# Patient Record
Sex: Female | Born: 1965 | Race: White | Hispanic: No | Smoking: Current every day smoker
Health system: Southern US, Community
[De-identification: ages and names within clinical notes are randomized; demographics above are authoritative.]

## PROBLEM LIST (undated history)

## (undated) DIAGNOSIS — G43909 Migraine, unspecified, not intractable, without status migrainosus: Secondary | ICD-10-CM

## (undated) HISTORY — DX: Migraine, unspecified, not intractable, without status migrainosus: G43.909

---

## 2000-11-17 ENCOUNTER — Other Ambulatory Visit: Admission: RE | Admit: 2000-11-17 | Discharge: 2000-11-17 | Payer: Self-pay | Admitting: Obstetrics and Gynecology

## 2002-07-15 ENCOUNTER — Other Ambulatory Visit: Admission: RE | Admit: 2002-07-15 | Discharge: 2002-07-15 | Payer: Self-pay | Admitting: Obstetrics and Gynecology

## 2003-07-26 ENCOUNTER — Other Ambulatory Visit: Admission: RE | Admit: 2003-07-26 | Discharge: 2003-07-26 | Payer: Self-pay | Admitting: Obstetrics and Gynecology

## 2004-12-07 ENCOUNTER — Other Ambulatory Visit: Admission: RE | Admit: 2004-12-07 | Discharge: 2004-12-07 | Payer: Self-pay | Admitting: Obstetrics and Gynecology

## 2011-09-24 ENCOUNTER — Encounter: Payer: Self-pay | Admitting: *Deleted

## 2011-09-24 ENCOUNTER — Emergency Department (HOSPITAL_COMMUNITY)
Admission: EM | Admit: 2011-09-24 | Discharge: 2011-09-24 | Disposition: A | Discharge status: Home / Self Care | Payer: BC Managed Care – PPO | Attending: Emergency Medicine | Admitting: Emergency Medicine

## 2011-09-24 DIAGNOSIS — R112 Nausea with vomiting, unspecified: Secondary | ICD-10-CM | POA: Insufficient documentation

## 2011-09-24 DIAGNOSIS — R10817 Generalized abdominal tenderness: Secondary | ICD-10-CM | POA: Insufficient documentation

## 2011-09-24 DIAGNOSIS — R070 Pain in throat: Secondary | ICD-10-CM | POA: Insufficient documentation

## 2011-09-24 DIAGNOSIS — R197 Diarrhea, unspecified: Secondary | ICD-10-CM | POA: Insufficient documentation

## 2011-09-24 DIAGNOSIS — R1084 Generalized abdominal pain: Secondary | ICD-10-CM | POA: Insufficient documentation

## 2011-09-24 LAB — DIFFERENTIAL
Basophils Absolute: 0 10*3/uL (ref 0.0–0.1)
Eosinophils Absolute: 0 10*3/uL (ref 0.0–0.7)
Eosinophils Relative: 0 % (ref 0–5)
Lymphs Abs: 0.4 10*3/uL — ABNORMAL LOW (ref 0.7–4.0)
Monocytes Absolute: 0.4 10*3/uL (ref 0.1–1.0)
Monocytes Relative: 3 % (ref 3–12)
Neutrophils Relative %: 95 % — ABNORMAL HIGH (ref 43–77)

## 2011-09-24 LAB — BASIC METABOLIC PANEL
BUN: 17 mg/dL (ref 6–23)
CO2: 24 mEq/L (ref 19–32)
GFR calc non Af Amer: 87 mL/min — ABNORMAL LOW (ref >90)
Glucose, Bld: 131 mg/dL — ABNORMAL HIGH (ref 70–99)
Potassium: 4.3 mEq/L (ref 3.5–5.1)

## 2011-09-24 LAB — CBC
Hemoglobin: 13.9 g/dL (ref 12.0–15.0)
MCH: 30.3 pg (ref 26.0–34.0)
MCHC: 33.7 g/dL (ref 30.0–36.0)
MCV: 89.8 fL (ref 78.0–100.0)
RBC: 4.59 MIL/uL (ref 3.87–5.11)

## 2011-09-24 MED ORDER — ONDANSETRON HCL 4 MG/2ML IJ SOLN
INTRAMUSCULAR | Status: AC
  Administered 2011-09-24: 4 mg
  Filled 2011-09-24: qty 2

## 2011-09-24 MED ORDER — ONDANSETRON 8 MG PO TBDP
8.0000 mg | ORAL_TABLET | Freq: Once | ORAL | Status: AC
  Administered 2011-09-24: 8 mg via ORAL
  Filled 2011-09-24: qty 1

## 2011-09-24 MED ORDER — ONDANSETRON 8 MG PO TBDP: 8.0000 mg | ORAL_TABLET | Freq: Three times a day (TID) | ORAL | Status: AC | PRN

## 2011-09-24 MED ORDER — ONDANSETRON HCL 4 MG/2ML IJ SOLN
INTRAMUSCULAR | Status: AC
  Administered 2011-09-24: 08:00:00
  Filled 2011-09-24: qty 2

## 2011-09-24 NOTE — ED Provider Notes (Signed)
History     CSN: [id number]  Arrival date & time 09/24/11  4098   First MD Initiated Contact with Patient 09/24/11 [id number]      Chief Complaint  Patient presents with  . Nausea  . Emesis  . Diarrhea    (Consider location/radiation/quality/duration/timing/severity/associated sxs/prior treatment) HPI Comments: Patient reports that approximately 8 hours ago she began having nausea, vomiting, and diarrhea.  She estimates that she has had approximately 10 episodes of vomiting and approximately 10 episodes of diarrhea.  She reports that she has had some generalized abdominal pain, but the pain did not begin until after vomiting several times.  No blood in stool or emesis.  She does not have any sick contacts.    Patient is a 46 y.o. female presenting with vomiting and diarrhea. The history is provided by the patient.  Emesis  This is a new problem. Episode onset: Approximately 8 hours ago. The problem has not changed since onset.The emesis has an appearance of stomach contents. There has been no fever. Associated symptoms include diarrhea. Pertinent negatives include no chills and no fever.  Diarrhea The primary symptoms include nausea, vomiting and diarrhea. Primary symptoms do not include fever, dysuria or rash.  The illness does not include chills.    History reviewed. No pertinent past medical history.  History reviewed. No pertinent past surgical history.  No family history on file.  History  Substance Use Topics  . Smoking status: Not on file  . Smokeless tobacco: Not on file  . Alcohol Use: Not on file    OB History    Grav Para Term Preterm Abortions TAB SAB Ect Mult Living                  Review of Systems  Constitutional: Negative for fever, chills and diaphoresis.  HENT: Positive for sore throat. Negative for neck pain and neck stiffness.   Respiratory: Negative for shortness of breath.   Cardiovascular: Negative for chest pain.  Gastrointestinal: Positive for  nausea, vomiting and diarrhea. Negative for blood in stool and abdominal distention.  Genitourinary: Negative for dysuria, hematuria and flank pain.  Skin: Negative for rash.  Neurological: Negative for dizziness and light-headedness.    Allergies  Review of patient's allergies indicates no known allergies.  Home Medications  No current outpatient prescriptions on file.  BP 112/80  Pulse 86  Temp(Src) 98 F (36.7 C) (Oral)  Resp 18  SpO2 98%  Physical Exam  Nursing note and vitals reviewed. Constitutional: She is oriented to person, place, and time. She appears well-developed and well-nourished.  HENT:  Head: Normocephalic and atraumatic.  Mouth/Throat: Oropharynx is clear and moist.  Neck: Normal range of motion. Neck supple.  Cardiovascular: Normal rate, regular rhythm and normal heart sounds.   No murmur heard. Pulmonary/Chest: Effort normal and breath sounds normal. No respiratory distress. She has no wheezes.  Abdominal: Bowel sounds are normal. She exhibits no distension and no mass. There is generalized tenderness. There is no rigidity, no rebound, no guarding, no CVA tenderness, no tenderness at McBurney's point and negative Murphy's sign.       Mild diffuse abdominal tenderness to palpation.  Neurological: She is alert and oriented to person, place, and time.  Skin: Skin is warm and dry. No rash noted.  Psychiatric: She has a normal mood and affect.    ED Course  Procedures (including critical care time)   Labs Reviewed  BASIC METABOLIC PANEL  CBC  DIFFERENTIAL   No  results found.   No diagnosis found.  9:49 AM Reassessed patient.  Patient is not having any abdominal pain at this time.  She is trying to drink po liquids.  Will reassess and if she is able to tolerate po liquids will discharge home with antiemetic.    10:19 AM Reassessed patient.  Patient reports that her symptoms have improved.  She has not vomited in a couple of hours.  She is able to  tolerate po liquids.  MDM  Patient with nausea, vomiting, and diarrhea.  She is only exhibiting mild generalized abdominal tenderness, which she did not have until after vomiting numerous times.  Patient is afebrile.  VSS.  Therefore, feel that symptoms are most likely gastroenteritis and feel that patient can be discharged with Rx for Zofran ODT.  Patient in agreeement with the plan.        Pascal Lux [facility] 09/24/11 2004

## 2011-09-24 NOTE — ED Notes (Signed)
QIO:NG29<BM> Expected date:<BR> Expected time:<BR> Means of arrival:<BR> Comments:<BR> EMS/N/V/D

## 2011-09-24 NOTE — ED Notes (Signed)
Per EMS:  Pt started having n/v/d continuously since 1am, she started having flu like symptoms (sore throat, malaise, sore throat) 2 days ago.  Pt had swab for flu and strep throat done at PCP, both came back negative. Pt also has lower right back pain 10/10

## 2011-09-24 NOTE — ED Notes (Signed)
Pt tolerated PO fluids

## 2011-09-24 NOTE — ED Notes (Signed)
MD at bedside. 

## 2011-09-28 NOTE — ED Provider Notes (Signed)
Medical screening examination/treatment/procedure(s) were performed by non-physician practitioner and as supervising physician I was immediately available for consultation/collaboration.  Hurman Horn, MD 09/28/11 2252

## 2012-04-24 ENCOUNTER — Other Ambulatory Visit: Payer: Self-pay | Admitting: Obstetrics and Gynecology

## 2012-04-24 DIAGNOSIS — R928 Other abnormal and inconclusive findings on diagnostic imaging of breast: Secondary | ICD-10-CM

## 2012-04-28 ENCOUNTER — Ambulatory Visit
Admission: RE | Admit: 2012-04-28 | Discharge: 2012-04-28 | Disposition: A | Discharge status: Home / Self Care | Payer: BC Managed Care – PPO | Source: Ambulatory Visit | Attending: Obstetrics and Gynecology | Admitting: Obstetrics and Gynecology

## 2012-04-28 DIAGNOSIS — R928 Other abnormal and inconclusive findings on diagnostic imaging of breast: Secondary | ICD-10-CM

## 2012-09-30 ENCOUNTER — Other Ambulatory Visit: Payer: Self-pay | Admitting: Family Medicine

## 2012-09-30 ENCOUNTER — Ambulatory Visit
Admission: RE | Admit: 2012-09-30 | Discharge: 2012-09-30 | Disposition: A | Discharge status: Home / Self Care | Payer: BC Managed Care – PPO | Source: Ambulatory Visit | Attending: Family Medicine | Admitting: Family Medicine

## 2012-09-30 DIAGNOSIS — M545 Low back pain: Secondary | ICD-10-CM

## 2014-07-21 ENCOUNTER — Other Ambulatory Visit: Payer: Self-pay | Admitting: Family Medicine

## 2014-07-21 DIAGNOSIS — Z139 Encounter for screening, unspecified: Secondary | ICD-10-CM

## 2014-08-09 ENCOUNTER — Ambulatory Visit
Admission: RE | Admit: 2014-08-09 | Discharge: 2014-08-09 | Disposition: A | Discharge status: Home / Self Care | Payer: No Typology Code available for payment source | Source: Ambulatory Visit | Attending: Family Medicine | Admitting: Family Medicine

## 2014-08-09 DIAGNOSIS — Z139 Encounter for screening, unspecified: Secondary | ICD-10-CM

## 2015-09-21 ENCOUNTER — Encounter: Payer: Self-pay | Admitting: Internal Medicine

## 2015-11-10 ENCOUNTER — Other Ambulatory Visit: Payer: Self-pay | Admitting: Obstetrics and Gynecology

## 2015-11-10 DIAGNOSIS — R928 Other abnormal and inconclusive findings on diagnostic imaging of breast: Secondary | ICD-10-CM

## 2015-11-17 ENCOUNTER — Ambulatory Visit
Admission: RE | Admit: 2015-11-17 | Discharge: 2015-11-17 | Disposition: A | Discharge status: Home / Self Care | Payer: Managed Care, Other (non HMO) | Source: Ambulatory Visit | Attending: Obstetrics and Gynecology | Admitting: Obstetrics and Gynecology

## 2015-11-17 DIAGNOSIS — R928 Other abnormal and inconclusive findings on diagnostic imaging of breast: Secondary | ICD-10-CM

## 2015-12-15 ENCOUNTER — Encounter: Payer: Self-pay | Admitting: Obstetrics and Gynecology

## 2017-01-31 ENCOUNTER — Encounter: Payer: Self-pay | Admitting: Physical Therapy

## 2017-01-31 ENCOUNTER — Ambulatory Visit: Payer: Managed Care, Other (non HMO) | Attending: Family Medicine | Admitting: Physical Therapy

## 2017-01-31 DIAGNOSIS — M25512 Pain in left shoulder: Secondary | ICD-10-CM | POA: Diagnosis present

## 2017-01-31 DIAGNOSIS — R293 Abnormal posture: Secondary | ICD-10-CM | POA: Diagnosis present

## 2017-01-31 DIAGNOSIS — M6281 Muscle weakness (generalized): Secondary | ICD-10-CM | POA: Insufficient documentation

## 2017-01-31 DIAGNOSIS — M25521 Pain in right elbow: Secondary | ICD-10-CM | POA: Insufficient documentation

## 2017-01-31 DIAGNOSIS — M542 Cervicalgia: Secondary | ICD-10-CM | POA: Diagnosis present

## 2017-01-31 DIAGNOSIS — M25612 Stiffness of left shoulder, not elsewhere classified: Secondary | ICD-10-CM | POA: Diagnosis present
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
## 2017-02-03 ENCOUNTER — Ambulatory Visit: Payer: Managed Care, Other (non HMO)

## 2017-02-03 DIAGNOSIS — M25512 Pain in left shoulder: Secondary | ICD-10-CM

## 2017-02-03 DIAGNOSIS — R293 Abnormal posture: Secondary | ICD-10-CM

## 2017-02-03 DIAGNOSIS — M6281 Muscle weakness (generalized): Secondary | ICD-10-CM | POA: Diagnosis not present

## 2017-02-03 DIAGNOSIS — M25612 Stiffness of left shoulder, not elsewhere classified: Secondary | ICD-10-CM

## 2017-02-03 DIAGNOSIS — M25521 Pain in right elbow: Secondary | ICD-10-CM

## 2017-02-03 DIAGNOSIS — M542 Cervicalgia: Secondary | ICD-10-CM

## 2017-02-03 NOTE — Therapy (Signed)
[facility] [address], [address] [place], [place], [address] Phone: [phone]   Fax:  [phone]  Physical Therapy Treatment  Patient Details  Name: Katie Gray MRN: [id number] Date of Birth: 02-25-1966 Referring Provider: Dr. Darrow Bussing  Encounter Date: 02/03/2017      PT End of Session - 02/03/17 1229    Visit Number 2   Date for PT Re-Evaluation 03/28/17   Authorization Type Patient visit limit includes chiropractor visits and she is seeing one also   PT Start Time 1146   PT Stop Time 1238   PT Time Calculation (min) 52 min   Activity Tolerance Patient tolerated treatment well   Behavior During Therapy [facility] for tasks assessed/performed      Past Medical History:  Diagnosis Date  . Migraines     History reviewed. No pertinent surgical history.  There were no vitals filed for this visit.      Subjective Assessment - 02/03/17 1150    Subjective Rt elbow pain and neck pain today.  Pt wants to try dry needling today.     Patient Stated Goals reduce pain and get better   Currently in Pain? Yes   Pain Score 6    Pain Location Neck   Pain Orientation Right;Left   Pain Descriptors / Indicators Aching   Pain Type Acute pain   Pain Onset More than a month ago   Pain Frequency Constant   Aggravating Factors  turning head, looking up   Pain Relieving Factors rest   Pain Score 7   Pain Location Elbow   Pain Orientation Right   Pain Descriptors / Indicators Aching;Sharp   Pain Type Acute pain   Pain Onset More than a month ago   Pain Frequency Constant   Aggravating Factors  using Rt UE   Pain Relieving Factors not using arm                         [facility] Adult PT Treatment/Exercise - 02/03/17 0001      Exercises   Exercises Elbow;Neck     Elbow Exercises   Elbow Flexion AAROM;20 reps   Wrist Flexion PROM;5 reps   Wrist Extension PROM;5 reps     Modalities   Modalities Moist  Heat     Moist Heat Therapy   Number Minutes Moist Heat 10 Minutes   Moist Heat Location Cervical     Manual Therapy   Manual Therapy Soft tissue mobilization;Myofascial release   Manual therapy comments soft tissue elongation to bil cervical paraspinals, suboccipitals and Rt wrist extensor tendons     Neck Exercises: Stretches   Other Neck Stretches cervical A/ROM in 3 directions. Gentle          Trigger Point Dry Needling - 02/03/17 1157    Consent Given? Yes   Education Handout Provided Yes   Muscles Treated Upper Body Upper trapezius;Oblique capitus  cervical multifidi and paraspinal   Upper Trapezius Response Twitch reponse elicited;Palpable increased muscle length   Oblique Capitus Response Twitch response elicited;Palpable increased muscle length              PT Education - 02/03/17 1155    Education provided Yes   Education Details elbow flexibility, wrist flexibility, cervical A/ROM, DN info   Person(s) Educated Patient   Methods Explanation;Demonstration;Handout   Comprehension Verbalized understanding;Returned demonstration          PT Short Term Goals - 02/03/17  1200      PT SHORT TERM GOAL #1   Title independent with initial HEP   Period Weeks   Status On-going     PT SHORT TERM GOAL #2   Title ability to reach behind her back and do her hair with >/= 25% greater   Time 4   Status On-going           PT Long Term Goals - 01/31/17 [id number]      PT LONG TERM GOAL #1   Title independent with HEP including returning to her running and prior gym program   Time 8   Period Weeks   Status New     PT LONG TERM GOAL #2   Title turning head to look behind while looking behind her with >/= 75% decreased pain and greater ease   Time 8   Period Weeks   Status New     PT LONG TERM GOAL #3   Title using arms to fix her hair and comb it with >/= 75% decreased in pain due to increased strength >/= 4/5 in UE   Time 8   Period Weeks   Status New      PT LONG TERM GOAL #4   Title being able to use bil. UE to put on sports bras or reach behind her due to full shoulder AROM   Time 8   Period Weeks   Status New     PT LONG TERM GOAL #5   Title sleep throung the night due to cervical and shoulder pain decreased >/= 75%   Time 8   Period Weeks   Status New     Additional Long Term Goals   Additional Long Term Goals Yes     PT LONG TERM GOAL #6   Title ability to lift a bottle of water and shake her protein shake due to right elbow pain decreased >/= 75% and right grip strength >/= 20#   Time 8   Period Weeks   Status New               Plan - 02/03/17 1200    Clinical Impression Statement Pt with only 1 session after evaluation.  PT initiated HEP for cervical flexibility and Rt wrist/elbow flexibility.  Pt with trigger points and tension in bil neck and upper trap.  Pt with improved mobilty and reduced pain after dry needling and manual therapy today. Pt only wanted to do neck today.  Will do dry needling to Rt forearm next.  Pt will continue to benefit from skilled PT for flexibility, gentle mobility, manual and modalities for neck, Rt wrist/elbow and Lt UE.     Rehab Potential Excellent   PT Frequency 2x / week   PT Duration 8 weeks   PT Treatment/Interventions Cryotherapy;Electrical Stimulation;Iontophoresis 4mg /ml Dexamethasone;Moist Heat;Ultrasound;Therapeutic activities;Therapeutic exercise;Neuromuscular re-education;Patient/family education;Passive range of motion;Manual techniques;Dry needling;Taping   PT Next Visit Plan ionto to Rt elbow (order is signed), manual to neck and Rt elbow, review HEP, posture education.  Add Lt shoulder A/ROM   Recommended Other Services initial certification is signed   Consulted and Agree with Plan of Care Patient      Patient will benefit from skilled therapeutic intervention in order to improve the following deficits and impairments:  Decreased range of motion, Increased fascial  restricitons, Increased muscle spasms, Decreased endurance, Decreased activity tolerance, Pain, Decreased strength, Decreased mobility, Postural dysfunction  Visit Diagnosis: Muscle weakness (generalized)  Acute pain of left shoulder  Pain in right elbow  Stiffness of left shoulder, not elsewhere classified  Cervicalgia  Abnormal posture     Problem List There are no active problems to display for this patient.  [name], PT 02/03/17 12:31 PM  [facility] [address] [place], [place], [address] Phone: [phone]   Fax:  [phone]  Name: ASHAWNTI TANGEN MRN: [id number] Date of Birth: 1966/03/25

## 2017-02-03 NOTE — Patient Instructions (Addendum)
AROM: Elbow Flexion / Extension   With left hand palm up, gently bend elbow as far as possible. Then straighten arm as far as possible. Repeat _10___ times per set. Do ____ sets per session. Do __3__ sessions per day.  Copyright  VHI. All rights reserved.  Wrist Flexor Stretch   Keeping elbow straight, grasp left hand and slowly bend wrist back until stretch is felt. Hold __10-20__ seconds. Relax. Repeat _3___ times per set. Do ____ sets per session. Do __3__ sessions per day.  Copyright  VHI. All rights reserved.  Wrist Extensor Stretch   Keeping elbow straight, grasp left hand and slowly bend wrist forward until stretch is felt. Hold __10-20__ seconds. Relax. Repeat __3__ times per set. Do ____ sets per session. Do __3__ sessions per day.  Copyright  VHI. All rights reserved.  PERFORM ALL EXERCISES GENTLY AND WITH GOOD POSTURE.    20 SECOND HOLD, 3 REPS TO EACH SIDE. 4-5 TIMES EACH DAY.   AROM: Neck Rotation   Turn head slowly to look over one shoulder, then the other.   AROM: Neck Flexion   Bend head forward.   AROM: Lateral Neck Flexion   Slowly tilt head toward one shoulder, then the other.    Trigger Point Dry Needling  . What is Trigger Point Dry Needling (DN)? o DN is a physical therapy technique used to treat muscle pain and dysfunction. Specifically, DN helps deactivate muscle trigger points (muscle knots).  o A thin filiform needle is used to penetrate the skin and stimulate the underlying trigger point. The goal is for a local twitch response (LTR) to occur and for the trigger point to relax. No medication of any kind is injected during the procedure.   . What Does Trigger Point Dry Needling Feel Like?  o The procedure feels different for each individual patient. Some patients report that they do not actually feel the needle enter the skin and overall the process is not painful. Very mild bleeding may occur. However, many patients feel a deep cramping  in the muscle in which the needle was inserted. This is the local twitch response.   Marland Kitchen. How Will I feel after the treatment? o Soreness is normal, and the onset of soreness may not occur for a few hours. Typically this soreness does not last longer than two days.  o Bruising is uncommon, however; ice can be used to decrease any possible bruising.  o In rare cases feeling tired or nauseous after the treatment is normal. In addition, your symptoms may get worse before they get better, this period will typically not last longer than 24 hours.   . What Can I do After My Treatment? o Increase your hydration by drinking more water for the next 24 hours. o You may place ice or heat on the areas treated that have become sore, however, do not use heat on inflamed or bruised areas. Heat often brings more relief post needling. o You can continue your regular activities, but vigorous activity is not recommended initially after the treatment for 24 hours. o DN is best combined with other physical therapy such as strengthening, stretching, and other therapies.    [facility] [address], [address] [place], [place] [address] Phone # [phone] Fax [phone] [facility] [address], [address] [place], [place] [address] Phone # [phone] Fax [phone]

## 2017-02-04 ENCOUNTER — Ambulatory Visit: Payer: Managed Care, Other (non HMO)

## 2017-02-04 DIAGNOSIS — M25521 Pain in right elbow: Secondary | ICD-10-CM

## 2017-02-04 DIAGNOSIS — M25612 Stiffness of left shoulder, not elsewhere classified: Secondary | ICD-10-CM

## 2017-02-04 DIAGNOSIS — R293 Abnormal posture: Secondary | ICD-10-CM

## 2017-02-04 DIAGNOSIS — M6281 Muscle weakness (generalized): Secondary | ICD-10-CM

## 2017-02-04 DIAGNOSIS — M25512 Pain in left shoulder: Secondary | ICD-10-CM

## 2017-02-04 DIAGNOSIS — M542 Cervicalgia: Secondary | ICD-10-CM

## 2017-02-04 NOTE — Therapy (Signed)
[facility] [address] [place], [place], [address] Phone: [phone]   Fax:  [phone]  Physical Therapy Treatment  Patient Details  Name: Katie Gray MRN: [id number] Date of Birth: [birth year] Referring Provider: Dr. Darrow Bussing  Encounter Date: 02/04/2017      PT End of Session - 02/04/17 1107    Visit Number 4   Date for PT Re-Evaluation 03/28/17   Authorization Type Patient visit limit includes chiropractor visits and she is seeing one also   PT Start Time 1147   PT Stop Time 1238   PT Time Calculation (min) 51 min   Activity Tolerance Patient tolerated treatment well   Behavior During Therapy [facility] for tasks assessed/performed      Past Medical History:  Diagnosis Date  . Migraines     History reviewed. No pertinent surgical history.  There were no vitals filed for this visit.      Subjective Assessment - 02/04/17 1016    Subjective I was sore but my neck felt better after dry needling yesterday.     Patient Stated Goals reduce pain and get better   Currently in Pain? Yes   Pain Score 6    Pain Location Neck   Pain Orientation Right;Left   Pain Score 7   Pain Location Elbow                         [facility] Adult PT Treatment/Exercise - 02/04/17 0001      Exercises   Exercises Shoulder     Elbow Exercises   Wrist Flexion PROM;5 reps   Wrist Extension PROM;5 reps     Shoulder Exercises: Pulleys   Flexion 3 minutes     Shoulder Exercises: Stretch   Elbow Flexion AAROM;20 reps   Wall Stretch - Flexion 10 seconds;5 reps   Other Shoulder Stretches overhead flexion with clasped hands: x10     Modalities   Modalities Moist Heat;Iontophoresis     Moist Heat Therapy   Number Minutes Moist Heat 10 Minutes   Moist Heat Location Cervical;Elbow     Iontophoresis   Type of Iontophoresis Dexamethasone   Location Rt lateral epicondyle   Dose 1.0 cc  #1   Time 6 hour  wear     Manual Therapy   Manual Therapy Soft tissue mobilization;Myofascial release   Manual therapy comments Lt elbow wrist extensor tendons  tension significantly reduced after needling and manual     Neck Exercises: Stretches   Other Neck Stretches cervical A/ROM in 3 directions. Gentle          Trigger Point Dry Needling - 02/04/17 1033    Consent Given? Yes   Muscles Treated Upper Body --  Rt wrist extensor tendons              PT Education - 02/04/17 1027    Education provided Yes   Education Details shoulder A/ROM, ionto instructions   Person(s) Educated Patient   Methods Demonstration;Handout;Explanation   Comprehension Verbalized understanding;Returned demonstration          PT Short Term Goals - 02/03/17 1200      PT SHORT TERM GOAL #1   Title independent with initial HEP   Period Weeks   Status On-going     PT SHORT TERM GOAL #2   Title ability to reach behind her back and do her hair with >/= 25% greater   Time 4  Status On-going           PT Long Term Goals - 01/31/17 [id number]      PT LONG TERM GOAL #1   Title independent with HEP including returning to her running and prior gym program   Time 8   Period Weeks   Status New     PT LONG TERM GOAL #2   Title turning head to look behind while looking behind her with >/= 75% decreased pain and greater ease   Time 8   Period Weeks   Status New     PT LONG TERM GOAL #3   Title using arms to fix her hair and comb it with >/= 75% decreased in pain due to increased strength >/= 4/5 in UE   Time 8   Period Weeks   Status New     PT LONG TERM GOAL #4   Title being able to use bil. UE to put on sports bras or reach behind her due to full shoulder AROM   Time 8   Period Weeks   Status New     PT LONG TERM GOAL #5   Title sleep throung the night due to cervical and shoulder pain decreased >/= 75%   Time 8   Period Weeks   Status New     Additional Long Term Goals   Additional Long  Term Goals Yes     PT LONG TERM GOAL #6   Title ability to lift a bottle of water and shake her protein shake due to right elbow pain decreased >/= 75% and right grip strength >/= 20#   Time 8   Period Weeks   Status New               Plan - 02/04/17 1019    Clinical Impression Statement Pt with increased Rt elbow/arm pain today.  Pt had multiple appointments yesterday and didn't have time to do exercises much.  PT reviewed HEP today.  Tension and trigger points in Rt elbow and reduced pain and  significant tension reduction after dry needling today.  Pt initiated exercise for Lt shoulder pain today.  Pt will continue to benefit from skilled PT for ionto for elbow, dry needling, flexibilty, strength and modalities to reduce pain.     Rehab Potential Excellent   PT Frequency 2x / week   PT Duration 8 weeks   PT Treatment/Interventions Cryotherapy;Electrical Stimulation;Iontophoresis 4mg /ml Dexamethasone;Moist Heat;Ultrasound;Therapeutic activities;Therapeutic exercise;Neuromuscular re-education;Patient/family education;Passive range of motion;Manual techniques;Dry needling;Taping   PT Next Visit Plan ionto to Rt elbow #2 , manual to neck and Rt elbow, dry needling to neck or elbow (alternate)   Consulted and Agree with Plan of Care Patient      Patient will benefit from skilled therapeutic intervention in order to improve the following deficits and impairments:  Decreased range of motion, Increased fascial restricitons, Increased muscle spasms, Decreased endurance, Decreased activity tolerance, Pain, Decreased strength, Decreased mobility, Postural dysfunction  Visit Diagnosis: Muscle weakness (generalized)  Acute pain of left shoulder  Pain in right elbow  Stiffness of left shoulder, not elsewhere classified  Cervicalgia  Abnormal posture     Problem List There are no active problems to display for this patient.   Lorrene ReidKelly Avelynn Sellin, PT 02/04/17 11:22 AM  [facility] [address] [place], [place], [address] Phone: [phone]   Fax:  [phone]  Name: Katie Gray MRN: [id number] Date of Birth: 10/10/1965

## 2017-02-04 NOTE — Patient Instructions (Addendum)
Hold all stretches 5-10 seconds and perform 5-10 times, 3 times a day.   Slide right arm up wall, with  PALM FACING THE WALL, by leaning toward wall.      Clasp hands together and raise arms above head, keeping elbows as straight as possible. Can be done sitting or lying.   Copyright  VHI. All rights reserved.  IONTOPHORESIS PATIENT PRECAUTIONS & CONTRAINDICATIONS:  . Redness under one or both electrodes can occur.  This characterized by a uniform redness that usually disappears within 12 hours of treatment. . Small pinhead size blisters may result in response to the drug.  Contact your physician if the problem persists more than 24 hours. . On rare occasions, iontophoresis therapy can result in temporary skin reactions such as rash, inflammation, irritation or burns.  The skin reactions may be the result of individual sensitivity to the ionic solution used, the condition of the skin at the start of treatment, reaction to the materials in the electrodes, allergies or sensitivity to dexamethasone, or a poor connection between the patch and your skin.  Discontinue using iontophoresis if you have any of these reactions and report to your therapist. . Remove the Patch or electrodes if you have any undue sensation of pain or burning during the treatment and report discomfort to your therapist. . Tell your Therapist if you have had known adverse reactions to the application of electrical current. . If using the Patch, the LED light will turn off when treatment is complete and the patch can be removed.  Approximate treatment time is 1-3 hours.  Remove the patch when light goes off or after 6 hours. . The Patch can be worn during normal activity, however excessive motion where the electrodes have been placed can cause poor contact between the skin and the electrode or uneven electrical current resulting in greater risk of skin irritation. Marland Kitchen. Keep out of the reach of children.   . DO NOT use if you  have a cardiac pacemaker or any other electrically sensitive implanted device. . DO NOT use if you have a known sensitivity to dexamethasone. . DO NOT use during Magnetic Resonance Imaging (MRI). . DO NOT use over broken or compromised skin (e.g. sunburn, cuts, or acne) due to the increased risk of skin reaction. . DO NOT SHAVE over the area to be treated:  To establish good contact between the Patch and the skin, excessive hair may be clipped. . DO NOT place the Patch or electrodes on or over your eyes, directly over your heart, or brain. . DO NOT reuse the Patch or electrodes as this may cause burns to occur.  [facility] [address], [address] [place], [place] [address] Phone # [phone] Fax [phone]

## 2017-02-10 ENCOUNTER — Ambulatory Visit: Payer: Managed Care, Other (non HMO)

## 2017-02-10 DIAGNOSIS — M25612 Stiffness of left shoulder, not elsewhere classified: Secondary | ICD-10-CM

## 2017-02-10 DIAGNOSIS — M6281 Muscle weakness (generalized): Secondary | ICD-10-CM | POA: Diagnosis not present

## 2017-02-10 DIAGNOSIS — R293 Abnormal posture: Secondary | ICD-10-CM

## 2017-02-10 DIAGNOSIS — M25521 Pain in right elbow: Secondary | ICD-10-CM

## 2017-02-10 NOTE — Therapy (Signed)
[facility] [address] [place], [place], [address] Phone: [phone]   Fax:  [phone]  Physical Therapy Treatment  Patient Details  Name: Katie Gray Gray MRN: [id number] Date of Birth: 15-Oct-1965 Referring Provider: Dr. Darrow Bussing  Encounter Date: 02/10/2017      PT End of Session - 02/10/17 1525    Date for PT Re-Evaluation 03/28/17   Authorization Type Patient visit limit includes chiropractor visits and she is seeing one also   PT Start Time 1457  late and dry needling   PT Stop Time 1537   PT Time Calculation (min) 40 min   Activity Tolerance Patient tolerated treatment well   Behavior During Therapy [facility] for tasks assessed/performed      Past Medical History:  Diagnosis Date  . Migraines     History reviewed. No pertinent surgical history.  There were no vitals filed for this visit.      Subjective Assessment - 02/10/17 1456    Subjective Pt reports that neck is feeling better.  Rt elbow hurting more.     Currently in Pain? Yes   Pain Score 5    Pain Location Neck   Pain Orientation Right;Left   Pain Descriptors / Indicators Aching   Pain Type Acute pain   Pain Onset More than a month ago   Pain Frequency Constant   Aggravating Factors  turning head, looking up   Pain Relieving Factors rest   Pain Score 7   Pain Location Elbow   Pain Orientation Right   Pain Descriptors / Indicators Aching;Sharp   Pain Type Acute pain   Pain Onset More than a month ago   Pain Frequency Constant   Aggravating Factors  using Rt UE   Pain Relieving Factors not using arm                         [facility] Adult PT Treatment/Exercise - 02/10/17 0001      Neck Exercises: Machines for Strengthening   UBE (Upper Arm Bike) Level 1 x 6 min (3/3)     Shoulder Exercises: Pulleys   Flexion 3 minutes     Modalities   Modalities Moist Heat;Iontophoresis     Moist Heat Therapy   Number  Minutes Moist Heat 10 Minutes   Moist Heat Location Cervical;Elbow     Iontophoresis   Type of Iontophoresis Dexamethasone   Location Rt lateral epicondyle   Dose 1.0 cc  #2   Time 6 hour wear     Manual Therapy   Manual Therapy Soft tissue mobilization;Myofascial release   Manual therapy comments Rt elbow wrist extensor tendons  tension significantly reduced after needling and manual     Neck Exercises: Stretches   Other Neck Stretches cervical A/ROM in 3 directions. Gentle          Trigger Point Dry Needling - 02/10/17 1501    Muscles Treated Upper Body --  Rt wrist extensor tendons                PT Short Term Goals - 02/10/17 1500      PT SHORT TERM GOAL #1   Title independent with initial HEP   Status Achieved     PT SHORT TERM GOAL #2   Title ability to reach behind her back and do her hair with >/= 25% greater   Time 4   Period Weeks   Status On-going  PT SHORT TERM GOAL #3   Title pain with sleeping decreased >/= 25%   Baseline 10%    Time 4   Period Weeks   Status On-going     PT SHORT TERM GOAL #4   Title pain with daily activities decreased >/= 25%   Time 4   Status On-going           PT Long Term Goals - 01/31/17 [id number]      PT LONG TERM GOAL #1   Title independent with HEP including returning to her running and prior gym program   Time 8   Period Weeks   Status New     PT LONG TERM GOAL #2   Title turning head to look behind while looking behind her with >/= 75% decreased pain and greater ease   Time 8   Period Weeks   Status New     PT LONG TERM GOAL #3   Title using arms to fix her hair and comb it with >/= 75% decreased in pain due to increased strength >/= 4/5 in UE   Time 8   Period Weeks   Status New     PT LONG TERM GOAL #4   Title being able to use bil. UE to put on sports bras or reach behind her due to full shoulder AROM   Time 8   Period Weeks   Status New     PT LONG TERM GOAL #5   Title sleep  throung the night due to cervical and shoulder pain decreased >/= 75%   Time 8   Period Weeks   Status New     Additional Long Term Goals   Additional Long Term Goals Yes     PT LONG TERM GOAL #6   Title ability to lift a bottle of water and shake her protein shake due to right elbow pain decreased >/= 75% and right grip strength >/= 20#   Time 8   Period Weeks   Status New               Plan - 02/10/17 1515    Clinical Impression Statement Pt had 2 days of reduced Rt elbow pain after dry needling last session.  Pt with trigger points and tension in Rt wrist extensor tendons today and demonstrated reduced hypersensitivity with manual therapy today.  Pt with 10% improvement in sleep since the start of care and demonstrates improved postural awareness.  Pt will continue to benefit from skilled PT for strength, flexibility, manual and modalities.    Rehab Potential Excellent   PT Frequency 2x / week   PT Duration 8 weeks   PT Treatment/Interventions Cryotherapy;Electrical Stimulation;Iontophoresis 4mg /ml Dexamethasone;Moist Heat;Ultrasound;Therapeutic activities;Therapeutic exercise;Neuromuscular re-education;Patient/family education;Passive range of motion;Manual techniques;Dry needling;Taping   PT Next Visit Plan ionto to Rt elbow #3 , manual to neck and Rt elbow, dry needling to neck next   Consulted and Agree with Plan of Care Patient      Patient will benefit from skilled therapeutic intervention in order to improve the following deficits and impairments:  Decreased range of motion, Increased fascial restricitons, Increased muscle spasms, Decreased endurance, Decreased activity tolerance, Pain, Decreased strength, Decreased mobility, Postural dysfunction  Visit Diagnosis: Muscle weakness (generalized)  Pain in right elbow  Stiffness of left shoulder, not elsewhere classified  Abnormal posture     Problem List There are no active problems to display for this  patient.    Katie Gray, PT 02/10/17 3:28 PM  [facility] [address] [place], [place], [address] Phone: [phone]   Fax:  [phone]  Name: Katie Gray MRN: [id number] Date of Birth: July 06, 1966

## 2017-02-12 ENCOUNTER — Ambulatory Visit: Payer: Managed Care, Other (non HMO)

## 2017-02-12 DIAGNOSIS — M542 Cervicalgia: Secondary | ICD-10-CM

## 2017-02-12 DIAGNOSIS — M6281 Muscle weakness (generalized): Secondary | ICD-10-CM

## 2017-02-12 DIAGNOSIS — M25512 Pain in left shoulder: Secondary | ICD-10-CM

## 2017-02-12 DIAGNOSIS — M25521 Pain in right elbow: Secondary | ICD-10-CM

## 2017-02-12 DIAGNOSIS — M25612 Stiffness of left shoulder, not elsewhere classified: Secondary | ICD-10-CM

## 2017-02-12 DIAGNOSIS — R293 Abnormal posture: Secondary | ICD-10-CM

## 2017-02-12 NOTE — Patient Instructions (Addendum)
KEEP HEAD IN NEUTRAL AND SHOULDERS DOWN AND RELAXED   Hold tubing in right hand, arm forward. Pull arm back, elbow straight. Repeat __10__ times per set. Do __2__ sets per session. Do _1-2___ sessions per day.  Copyright  VHI. All rights reserved.     With resistive band anchored in door, grasp both ends. Keeping elbows bent, pull back, squeezing shoulder blades together. Hold _3__ seconds. Repeat _2x10___ times. Do _1-2___ sessions per day.  [email or web address]   [facility] [address], [address] [place], [place] [address] Phone # [phone] Fax [phone] 

## 2017-02-12 NOTE — Therapy (Signed)
[facility] [address] [place], [place], [address] Phone: [phone]   Fax:  [phone]  Physical Therapy Treatment  Patient Details  Name: Katie Gray MRN: [id number] Date of Birth: [birth year] Referring Provider: Dr. Darrow Bussing  Encounter Date: 02/12/2017      PT End of Session - 02/12/17 1101    Visit Number 5   Date for PT Re-Evaluation 03/28/17   Authorization Type Patient visit limit includes chiropractor visits and she is seeing one also   PT Start Time 1016   PT Stop Time 1113   PT Time Calculation (min) 57 min   Activity Tolerance Patient tolerated treatment well   Behavior During Therapy [facility] for tasks assessed/performed      Past Medical History:  Diagnosis Date  . Migraines     History reviewed. No pertinent surgical history.  There were no vitals filed for this visit.      Subjective Assessment - 02/12/17 1020    Subjective Pt reports that her Rt elbow is sore after needling.  Increased pain due to flat ironing hair this morning.     Currently in Pain? Yes   Pain Score 5    Pain Location Neck   Pain Orientation Left   Pain Descriptors / Indicators Aching;Tightness   Pain Type Acute pain   Pain Onset More than a month ago   Pain Frequency Constant   Aggravating Factors  turning head, looking up   Pain Relieving Factors rest                         [facility] Adult PT Treatment/Exercise - 02/12/17 0001      Neck Exercises: Machines for Strengthening   UBE (Upper Arm Bike) Level 1 x 6 min (3/3)     Shoulder Exercises: Standing   Extension Strengthening;Both;20 reps;Theraband   Theraband Level (Shoulder Extension) Level 1 (Yellow)   Row Strengthening;Both;20 reps;Theraband   Theraband Level (Shoulder Row) Level 1 (Yellow)     Shoulder Exercises: Pulleys   Flexion 3 minutes     Modalities   Modalities Moist Heat;Cryotherapy     Moist Heat Therapy   Number  Minutes Moist Heat 10 Minutes   Moist Heat Location Cervical     Cryotherapy   Number Minutes Cryotherapy 10 Minutes   Cryotherapy Location Forearm   Type of Cryotherapy Ice pack     Manual Therapy   Manual Therapy Soft tissue mobilization;Myofascial release   Manual therapy comments soft tissue elongation to bil cervical paraspinals, suboccipitals and Rt wrist extensor tendons     Neck Exercises: Stretches   Other Neck Stretches cervical A/ROM in 3 directions. Gentle          Trigger Point Dry Needling - 02/12/17 1026    Consent Given? Yes   Muscles Treated Upper Body Upper trapezius;Oblique capitus;Suboccipitals muscle group   Upper Trapezius Response Twitch reponse elicited;Palpable increased muscle length   Oblique Capitus Response Twitch response elicited;Palpable increased muscle length   SubOccipitals Response Twitch response elicited;Palpable increased muscle length              PT Education - 02/12/17 1028    Education provided Yes   Education Details yellow theraband attached   Person(s) Educated Patient   Methods Explanation;Demonstration;Handout   Comprehension Verbalized understanding;Returned demonstration          PT Short Term Goals - 02/10/17 1500      PT SHORT  TERM GOAL #1   Title independent with initial HEP   Status Achieved     PT SHORT TERM GOAL #2   Title ability to reach behind her back and do her hair with >/= 25% greater   Time 4   Period Weeks   Status On-going     PT SHORT TERM GOAL #3   Title pain with sleeping decreased >/= 25%   Baseline 10%    Time 4   Period Weeks   Status On-going     PT SHORT TERM GOAL #4   Title pain with daily activities decreased >/= 25%   Time 4   Status On-going           PT Long Term Goals - 01/31/17 [id number]      PT LONG TERM GOAL #1   Title independent with HEP including returning to her running and prior gym program   Time 8   Period Weeks   Status New     PT LONG TERM GOAL #2    Title turning head to look behind while looking behind her with >/= 75% decreased pain and greater ease   Time 8   Period Weeks   Status New     PT LONG TERM GOAL #3   Title using arms to fix her hair and comb it with >/= 75% decreased in pain due to increased strength >/= 4/5 in UE   Time 8   Period Weeks   Status New     PT LONG TERM GOAL #4   Title being able to use bil. UE to put on sports bras or reach behind her due to full shoulder AROM   Time 8   Period Weeks   Status New     PT LONG TERM GOAL #5   Title sleep throung the night due to cervical and shoulder pain decreased >/= 75%   Time 8   Period Weeks   Status New     Additional Long Term Goals   Additional Long Term Goals Yes     PT LONG TERM GOAL #6   Title ability to lift a bottle of water and shake her protein shake due to right elbow pain decreased >/= 75% and right grip strength >/= 20#   Time 8   Period Weeks   Status New               Plan - 02/12/17 1030    Clinical Impression Statement Pt with increased elbow pain secondary to doing hair this morning.  Pt would like to hold on ionto patch until next time.  Pt with tension and trigger points in bil neck and upper traps and demonstrated improved tissue mobility after dry needling today.  Pt initiated scapular stabilization exercises today.  Pt will continue to benefit from skilled PT for postural strength, cervical flexibility and elbow flexibility and strength.  Ionto and modalities/manual as needed.     Rehab Potential Excellent   PT Frequency 2x / week   PT Duration 8 weeks   PT Treatment/Interventions Cryotherapy;Electrical Stimulation;Iontophoresis 4mg /ml Dexamethasone;Moist Heat;Ultrasound;Therapeutic activities;Therapeutic exercise;Neuromuscular re-education;Patient/family education;Passive range of motion;Manual techniques;Dry needling;Taping   PT Next Visit Plan ionto to Rt elbow #3 , manual to neck and Rt elbow, strength and flexibility.    Consulted and Agree with Plan of Care Patient      Patient will benefit from skilled therapeutic intervention in order to improve the following deficits and impairments:  Decreased range of motion,  Increased fascial restricitons, Increased muscle spasms, Decreased endurance, Decreased activity tolerance, Pain, Decreased strength, Decreased mobility, Postural dysfunction  Visit Diagnosis: Stiffness of left shoulder, not elsewhere classified  Pain in right elbow  Abnormal posture  Acute pain of left shoulder  Cervicalgia  Muscle weakness (generalized)     Problem List There are no active problems to display for this patient.    [name], PT 02/12/17 11:03 AM  [facility] [address] [place], [place], [address] Phone: [phone]   Fax:  [phone]  Name: BRIA SPARR MRN: [id number] Date of Birth: October 23, 1965

## 2017-02-14 ENCOUNTER — Encounter: Payer: Managed Care, Other (non HMO) | Admitting: Physical Therapy

## 2017-02-18 ENCOUNTER — Ambulatory Visit: Payer: Managed Care, Other (non HMO)

## 2017-02-18 DIAGNOSIS — M25512 Pain in left shoulder: Secondary | ICD-10-CM

## 2017-02-18 DIAGNOSIS — M542 Cervicalgia: Secondary | ICD-10-CM

## 2017-02-18 DIAGNOSIS — M6281 Muscle weakness (generalized): Secondary | ICD-10-CM

## 2017-02-18 DIAGNOSIS — M25521 Pain in right elbow: Secondary | ICD-10-CM

## 2017-02-18 DIAGNOSIS — R293 Abnormal posture: Secondary | ICD-10-CM

## 2017-02-18 DIAGNOSIS — M25612 Stiffness of left shoulder, not elsewhere classified: Secondary | ICD-10-CM

## 2017-02-18 NOTE — Therapy (Signed)
[facility] [address] [place], [place], [address] Phone: [phone]   Fax:  [phone]  Physical Therapy Treatment  Patient Details  Name: Katie Gray MRN: [id number] Date of Birth: [birth year] Referring Provider: Dr. Darrow Bussing  Encounter Date: 02/18/2017      PT End of Session - 02/18/17 1013    Visit Number 6   Date for PT Re-Evaluation 03/28/17   Authorization Type Patient visit limit includes chiropractor visits and she is seeing one also   PT Start Time 0932   PT Stop Time 1011   PT Time Calculation (min) 39 min   Activity Tolerance Patient tolerated treatment well   Behavior During Therapy [facility] for tasks assessed/performed      Past Medical History:  Diagnosis Date  . Migraines     History reviewed. No pertinent surgical history.  There were no vitals filed for this visit.      Subjective Assessment - 02/18/17 0934    Subjective My elbow really hurts when I do my hair.  I don't want to do needling today.     Patient Stated Goals reduce pain and get better   Currently in Pain? Yes   Pain Score 5    Pain Location Neck   Pain Orientation Left   Pain Descriptors / Indicators Tightness   Pain Type Acute pain   Pain Onset More than a month ago   Pain Frequency Constant   Aggravating Factors  turning head, looking up    Pain Score 5   Pain Location Shoulder   Pain Orientation Left   Pain Descriptors / Indicators Aching   Pain Type Acute pain   Pain Onset More than a month ago   Pain Frequency Constant   Aggravating Factors  reaching back   Pain Relieving Factors arm at side   Pain Score 7   Pain Location Elbow   Pain Orientation Right   Pain Descriptors / Indicators Aching;Sharp   Pain Type Acute pain   Pain Onset More than a month ago   Pain Frequency Constant   Aggravating Factors  using Rt UE, endurance tasks   Pain Relieving Factors not using arm                          [facility] Adult PT Treatment/Exercise - 02/18/17 0001      Neck Exercises: Machines for Strengthening   UBE (Upper Arm Bike) Level 1 x 6 min (3/3)     Shoulder Exercises: Standing   Extension Strengthening;Both;20 reps;Theraband   Theraband Level (Shoulder Extension) Level 1 (Yellow)   Row Strengthening;Both;20 reps;Theraband   Theraband Level (Shoulder Row) Level 1 (Yellow)     Shoulder Exercises: Pulleys   Flexion 3 minutes     Modalities   Modalities Ultrasound     Ultrasound   Ultrasound Location Rt elbow/forearm   Ultrasound Parameters 1.1 w/cm2 50% pulsed x 6 minutes   Ultrasound Goals Pain     Iontophoresis   Type of Iontophoresis Dexamethasone   Location Rt lateral epicondyle   Dose 1.0 cc  #3   Time 6 hour wear     Manual Therapy   Manual Therapy Soft tissue mobilization;Myofascial release   Manual therapy comments soft tissue elongation to Rt wrist extensor tendons                  PT Short Term Goals - 02/18/17 6962  PT SHORT TERM GOAL #2   Title ability to reach behind her back and do her hair with >/= 25% greater   Status Achieved     PT SHORT TERM GOAL #3   Title pain with sleeping decreased >/= 25%   Status Achieved     PT SHORT TERM GOAL #4   Title pain with daily activities decreased >/= 25%   Status Achieved           PT Long Term Goals - 01/31/17 [id number]      PT LONG TERM GOAL #1   Title independent with HEP including returning to her running and prior gym program   Time 8   Period Weeks   Status New     PT LONG TERM GOAL #2   Title turning head to look behind while looking behind her with >/= 75% decreased pain and greater ease   Time 8   Period Weeks   Status New     PT LONG TERM GOAL #3   Title using arms to fix her hair and comb it with >/= 75% decreased in pain due to increased strength >/= 4/5 in UE   Time 8   Period Weeks   Status New     PT LONG TERM GOAL #4   Title  being able to use bil. UE to put on sports bras or reach behind her due to full shoulder AROM   Time 8   Period Weeks   Status New     PT LONG TERM GOAL #5   Title sleep throung the night due to cervical and shoulder pain decreased >/= 75%   Time 8   Period Weeks   Status New     Additional Long Term Goals   Additional Long Term Goals Yes     PT LONG TERM GOAL #6   Title ability to lift a bottle of water and shake her protein shake due to right elbow pain decreased >/= 75% and right grip strength >/= 20#   Time 8   Period Weeks   Status New               Plan - 02/18/17 [id number]    Clinical Impression Statement Pt reports a 50% overall improvement in frequency and intensity of pain.  Pt reports increased Rt elbow pain with endurance tasks including doing her hair.  Pt has not been doing scapular stabilization exercises at home and will begin this week.  Pt with improved scapular position with these exercises today.  Pt declined dry needling today and will resume as needed.  Pt will continue to benefit from skilled PT for postural strength, cervical flexibility and elbow flexibility and strength.     Rehab Potential Excellent   PT Frequency 2x / week   PT Duration 8 weeks   PT Treatment/Interventions Cryotherapy;Electrical Stimulation;Iontophoresis 4mg /ml Dexamethasone;Moist Heat;Ultrasound;Therapeutic activities;Therapeutic exercise;Neuromuscular re-education;Patient/family education;Passive range of motion;Manual techniques;Dry needling;Taping   PT Next Visit Plan ionto to Rt elbow #4 , manual to neck and Rt elbow, strength and flexibility.   Consulted and Agree with Plan of Care Patient      Patient will benefit from skilled therapeutic intervention in order to improve the following deficits and impairments:  Decreased range of motion, Increased fascial restricitons, Increased muscle spasms, Decreased endurance, Decreased activity tolerance, Pain, Decreased strength, Decreased  mobility, Postural dysfunction  Visit Diagnosis: Stiffness of left shoulder, not elsewhere classified  Pain in right elbow  Abnormal posture  Acute  pain of left shoulder  Cervicalgia  Muscle weakness (generalized)     Problem List There are no active problems to display for this patient.    [name], PT 02/18/17 10:15 AM  [facility] [address], [address] [place], [place], [address] Phone: [phone]   Fax:  [phone]  Name: Katie Gray MRN: [id number] Date of Birth: [birth year]

## 2017-02-24 ENCOUNTER — Ambulatory Visit: Payer: Managed Care, Other (non HMO) | Attending: Family Medicine

## 2017-02-24 DIAGNOSIS — M25512 Pain in left shoulder: Secondary | ICD-10-CM | POA: Diagnosis present

## 2017-02-24 DIAGNOSIS — M25612 Stiffness of left shoulder, not elsewhere classified: Secondary | ICD-10-CM | POA: Diagnosis not present

## 2017-02-24 DIAGNOSIS — M542 Cervicalgia: Secondary | ICD-10-CM | POA: Insufficient documentation

## 2017-02-24 DIAGNOSIS — M6281 Muscle weakness (generalized): Secondary | ICD-10-CM | POA: Diagnosis present

## 2017-02-24 DIAGNOSIS — M25521 Pain in right elbow: Secondary | ICD-10-CM | POA: Insufficient documentation

## 2017-02-24 DIAGNOSIS — R293 Abnormal posture: Secondary | ICD-10-CM | POA: Diagnosis present

## 2017-02-24 NOTE — Therapy (Signed)
[facility] [address] [place], [place], [address] Phone: [phone]   Fax:  [phone]  Physical Therapy Treatment  Patient Details  Name: Katie Gray MRN: [id number] Date of Birth: 1965/12/24 Referring Provider: Dr. Darrow Bussing  Encounter Date: 02/24/2017      PT End of Session - 02/24/17 0928    Visit Number 7   Date for PT Re-Evaluation 03/28/17   PT Start Time 0847   PT Stop Time 0928   PT Time Calculation (min) 41 min   Activity Tolerance Patient tolerated treatment well   Behavior During Therapy [facility] for tasks assessed/performed      Past Medical History:  Diagnosis Date  . Migraines     History reviewed. No pertinent surgical history.  There were no vitals filed for this visit.      Subjective Assessment - 02/24/17 0849    Subjective Rt elbow feels better overall.  Neck feels stiff and pain is better.     Currently in Pain? Yes   Pain Score 4    Pain Location Neck   Pain Descriptors / Indicators Tightness   Pain Type Acute pain   Pain Onset More than a month ago   Pain Frequency Constant   Aggravating Factors  turning head, looking up   Pain Relieving Factors rest   Pain Score 4   Pain Location Shoulder   Pain Orientation Left   Pain Descriptors / Indicators Aching   Pain Type Acute pain   Pain Onset More than a month ago   Pain Frequency Constant   Aggravating Factors  reaching back   Pain Relieving Factors not reaching back   Pain Score 5   Pain Location Elbow   Pain Orientation Right   Pain Descriptors / Indicators Aching;Sharp   Pain Type Acute pain   Pain Onset More than a month ago   Pain Frequency Constant   Aggravating Factors  using Rt UE, endurance tasks   Pain Relieving Factors not using arm            [facility] PT Assessment - 02/24/17 0001      Strength   Right Hand Gross Grasp Impaired  23#                     [facility] Adult PT  Treatment/Exercise - 02/24/17 0001      Exercises   Exercises Hand;Elbow     Elbow Exercises   Other elbow exercises velcro board: supination/pronation x 10      Neck Exercises: Machines for Strengthening   UBE (Upper Arm Bike) Level 1 x 6 min (3/3)     Shoulder Exercises: Standing   Extension Strengthening;Both;20 reps;Theraband   Theraband Level (Shoulder Extension) Level 1 (Yellow)   Row Strengthening;Both;20 reps;Theraband   Theraband Level (Shoulder Row) Level 1 (Yellow)     Shoulder Exercises: Pulleys   Flexion 3 minutes     Hand Exercises   Digiticizer mass grip: red x 4 minutes     Ultrasound   Ultrasound Location Rt elbow/forearm   Ultrasound Parameters 1.1 w/cm2 50% pulsed to Rt wrist extensor tendons x 6 minutes     Iontophoresis   Type of Iontophoresis Dexamethasone   Location Rt lateral epicondyle   Dose 1.0 cc  #4   Time 6 hour wear     Neck Exercises: Stretches   Other Neck Stretches cervical A/ROM in 3 directions. Gentle  PT Short Term Goals - 02/18/17 [id number]      PT SHORT TERM GOAL #2   Title ability to reach behind her back and do her hair with >/= 25% greater   Status Achieved     PT SHORT TERM GOAL #3   Title pain with sleeping decreased >/= 25%   Status Achieved     PT SHORT TERM GOAL #4   Title pain with daily activities decreased >/= 25%   Status Achieved           PT Long Term Goals - 02/24/17 0858      PT LONG TERM GOAL #1   Title independent with HEP including returning to her running and prior gym program   Time 8   Period Weeks   Status On-going     PT LONG TERM GOAL #2   Title turning head to look behind while looking behind her with >/= 75% decreased pain and greater ease   Time 8   Period Weeks   Status On-going     PT LONG TERM GOAL #3   Title using arms to fix her hair and comb it with >/= 75% decreased in pain due to increased strength >/= 4/5 in UE   Time 8   Period Weeks   Status  On-going     PT LONG TERM GOAL #5   Title sleep throung the night due to cervical and shoulder pain decreased >/= 75%   Baseline waking 3x/night   Time 8   Period Weeks   Status On-going     PT LONG TERM GOAL #6   Title ability to lift a bottle of water and shake her protein shake due to right elbow pain decreased >/= 75% and right grip strength >/= 20#   Baseline 23# but still needs to use 2 hands with protein shake   Time 8   Period Weeks   Status On-going               Plan - 02/24/17 [id number]    Clinical Impression Statement Pt reports 60% overall improvement in symptoms since the start of care.  Pt with intermittent and improved Rt elbow pain.  Pain is worse with squeezing and twisting with the Rt arm.  Pt with limited Lt cervical sidebending.  Pt with improved postural awareness at home an work.  Pt with improved Rt grip to 23# (8# at evaluation).  Pt continues to have difficulty with shaking her protein shake and needs to use 2  hands with this.  Pt will continue to benefit from skilled PT for elbow strength, flexibility , postural strength, modalities and manual.     Rehab Potential Excellent   PT Frequency 2x / week   PT Duration 8 weeks   PT Treatment/Interventions Cryotherapy;Electrical Stimulation;Iontophoresis 4mg /ml Dexamethasone;Moist Heat;Ultrasound;Therapeutic activities;Therapeutic exercise;Neuromuscular re-education;Patient/family education;Passive range of motion;Manual techniques;Dry needling;Taping   PT Next Visit Plan ionto to Rt elbow #5 , manual to neck and Rt elbow, strength and flexibility.   Consulted and Agree with Plan of Care Patient      Patient will benefit from skilled therapeutic intervention in order to improve the following deficits and impairments:  Decreased range of motion, Increased fascial restricitons, Increased muscle spasms, Decreased endurance, Decreased activity tolerance, Pain, Decreased strength, Decreased mobility, Postural  dysfunction  Visit Diagnosis: Stiffness of left shoulder, not elsewhere classified  Pain in right elbow  Abnormal posture  Acute pain of left shoulder  Cervicalgia  Muscle weakness (generalized)  Problem List There are no active problems to display for this patient.    [name], PT 02/24/17 9:30 AM  [facility] [address], [address] [place], [place], [address] Phone: [phone]   Fax:  [phone]  Name: Katie Gray MRN: [id number] Date of Birth: [birth year]

## 2017-03-04 ENCOUNTER — Ambulatory Visit: Payer: Managed Care, Other (non HMO) | Admitting: Physical Therapy

## 2017-03-04 DIAGNOSIS — M25512 Pain in left shoulder: Secondary | ICD-10-CM

## 2017-03-04 DIAGNOSIS — M25612 Stiffness of left shoulder, not elsewhere classified: Secondary | ICD-10-CM

## 2017-03-04 DIAGNOSIS — M6281 Muscle weakness (generalized): Secondary | ICD-10-CM

## 2017-03-04 DIAGNOSIS — R293 Abnormal posture: Secondary | ICD-10-CM

## 2017-03-04 DIAGNOSIS — M25521 Pain in right elbow: Secondary | ICD-10-CM

## 2017-03-04 DIAGNOSIS — M542 Cervicalgia: Secondary | ICD-10-CM

## 2017-03-04 NOTE — Therapy (Signed)
[facility] [address], [address] [place], [place], [address] Phone: [phone]   Fax:  [phone]  Physical Therapy Treatment  Patient Details  Name: Katie Gray MRN: [id number] Date of Birth: 1966/04/27 Referring Provider: Dr. Darrow Bussing  Encounter Date: 03/04/2017      PT End of Session - 03/04/17 0936    Visit Number 8   Date for PT Re-Evaluation 03/28/17   Authorization Type Patient visit limit includes chiropractor visits and she is seeing one also   PT Start Time 0845   PT Stop Time 0930   PT Time Calculation (min) 45 min   Activity Tolerance Patient tolerated treatment well      Past Medical History:  Diagnosis Date  . Migraines     No past surgical history on file.  There were no vitals filed for this visit.      Subjective Assessment - 03/04/17 0852    Subjective Shoulder, neck and elbow pain.  Not sure why it is so bad.  I didn't take my medicine this morning.  Therapy is helping but today is just a bad day.  I tried the DN but I bruised real bad so I don't want to do that anymore.  I might try the DN to my neck tomorrow.     Currently in Pain? Yes   Pain Score 7    Pain Location Elbow   Pain Orientation Left   Pain Type Acute pain   Aggravating Factors  fixing hair;  turning head to the left   Pain Relieving Factors U/S, ionto, ice, heat                         [facility] Adult PT Treatment/Exercise - 03/04/17 0001      Shoulder Exercises: Seated   Other Seated Exercises cervical ROM all planes during U/S     Shoulder Exercises: Standing   Extension Strengthening;Both;20 reps;Theraband over the door   Theraband Level (Shoulder Extension) Level 1 (Yellow)     Shoulder Exercises: Pulleys   Flexion 3 minutes     Shoulder Exercises: ROM/Strengthening   UBE (Upper Arm Bike) 14 min forward/backward     Shoulder Exercises: Power [employer] 25 reps   Row Limitations 20#      Hand Exercises for Cervical Radiculopathy   Other Hand Exercise for Cervical Radiculopathy velcro roller pronation/supination 3x     Hand Exercises   Digiticizer mass grip and 1 finger at a time: red x     Ultrasound   Ultrasound Location right    Ultrasound Parameters 1.1 w/cm2 50% right forearm extensor 6 min   Ultrasound Goals Pain     Iontophoresis   Type of Iontophoresis Dexamethasone   Location Rt lateral epicondyle   Dose 1.0 cc  #5   Time 6 hour wear                  PT Short Term Goals - 03/04/17 0951      PT SHORT TERM GOAL #1   Title independent with initial HEP   Status Achieved     PT SHORT TERM GOAL #2   Title ability to reach behind her back and do her hair with >/= 25% greater   Status Achieved     PT SHORT TERM GOAL #3   Title pain with sleeping decreased >/= 25%   Status Achieved     PT SHORT  TERM GOAL #4   Title pain with daily activities decreased >/= 25%   Status Achieved           PT Long Term Goals - 03/04/17 0955      PT LONG TERM GOAL #1   Title independent with HEP including returning to her running and prior gym program   Time 8   Period Weeks   Status On-going     PT LONG TERM GOAL #2   Title turning head to look behind while looking behind her with >/= 75% decreased pain and greater ease   Time 8   Period Weeks   Status On-going     PT LONG TERM GOAL #3   Title using arms to fix her hair and comb it with >/= 75% decreased in pain due to increased strength >/= 4/5 in UE   Time 8   Period Weeks   Status On-going     PT LONG TERM GOAL #4   Title being able to use bil. UE to put on sports bras or reach behind her due to full shoulder AROM   Time 8   Period Weeks   Status On-going     PT LONG TERM GOAL #5   Title sleep throung the night due to cervical and shoulder pain decreased >/= 75%   Time 8   Period Weeks   Status On-going     PT LONG TERM GOAL #6   Title ability to lift a bottle of water  and shake her protein shake due to right elbow pain decreased >/= 75% and right grip strength >/= 20#   Time 8   Period Weeks   Status On-going               Plan - 03/04/17 0936    Clinical Impression Statement The patient has increased elbow, shoulder and neck pain today for no apparent reason but states she has been improving overall.  She is highly motivated to exercise and needs close monitoring to avoid "overdoing it."  She also needs cues to avoid compensatory upper trap elevation with scapular exercises.  Tenderness over forearm extensor wad muscles but  residual bruising from DN improving.     PT Frequency 2x / week   PT Duration 8 weeks   PT Treatment/Interventions Cryotherapy;Electrical Stimulation;Iontophoresis 4mg /ml Dexamethasone;Moist Heat;Ultrasound;Therapeutic activities;Therapeutic exercise;Neuromuscular re-education;Patient/family education;Passive range of motion;Manual techniques;Dry needling;Taping   PT Next Visit Plan ionto to Rt elbow #6 , ultrasound forearm as needed;  DN for neck;   manual to neck and Rt elbow, limit strengthening secondary to back to back visits;  left cervical rotation      Patient will benefit from skilled therapeutic intervention in order to improve the following deficits and impairments:  Decreased range of motion, Increased fascial restricitons, Increased muscle spasms, Decreased endurance, Decreased activity tolerance, Pain, Decreased strength, Decreased mobility, Postural dysfunction  Visit Diagnosis: Stiffness of left shoulder, not elsewhere classified  Pain in right elbow  Abnormal posture  Acute pain of left shoulder  Cervicalgia  Muscle weakness (generalized)     Problem List There are no active problems to display for this patient.  Lavinia SharpsStacy Lonna Rabold, PT 03/04/17 10:01 AM Phone: [phone] Fax: [phone]  Vivien PrestoSimpson, Jep Dyas C 03/04/2017, 9:59 AM  [facility] [address], [address] [place], [place], [address] Phone: [phone]   Fax:  [phone]  Name: Katie Gray MRN: [id number] Date of Birth: 1966-03-03

## 2017-03-05 ENCOUNTER — Ambulatory Visit: Payer: Managed Care, Other (non HMO)

## 2017-03-05 DIAGNOSIS — M25612 Stiffness of left shoulder, not elsewhere classified: Secondary | ICD-10-CM

## 2017-03-05 DIAGNOSIS — R293 Abnormal posture: Secondary | ICD-10-CM

## 2017-03-05 DIAGNOSIS — M25521 Pain in right elbow: Secondary | ICD-10-CM

## 2017-03-05 DIAGNOSIS — M542 Cervicalgia: Secondary | ICD-10-CM

## 2017-03-05 DIAGNOSIS — M25512 Pain in left shoulder: Secondary | ICD-10-CM

## 2017-03-05 DIAGNOSIS — M6281 Muscle weakness (generalized): Secondary | ICD-10-CM

## 2017-03-05 NOTE — Therapy (Signed)
[facility] [address], [address] [place], [place], [address] Phone: [phone]   Fax:  [phone]  Physical Therapy Treatment  Patient Details  Name: Katie Gray MRN: [id number] Date of Birth: Dec 15, 1965 Referring Provider: Dr. Darrow Bussing  Encounter Date: 03/05/2017      PT End of Session - 03/05/17 1013    Visit Number 9   Date for PT Re-Evaluation 03/28/17   Authorization Type Patient visit limit includes chiropractor visits and she is seeing one also   PT Start Time 0947  late   PT Stop Time 1022   PT Time Calculation (min) 35 min   Activity Tolerance Patient tolerated treatment well   Behavior During Therapy [facility] for tasks assessed/performed      Past Medical History:  Diagnosis Date  . Migraines     History reviewed. No pertinent surgical history.  There were no vitals filed for this visit.      Subjective Assessment - 03/05/17 0953    Subjective Pt reports 50% overall improvement since the start of care.  I had a bad day yesterday with my Rt arm.  I feel stiff in my neck.     Currently in Pain? Yes   Pain Score 5    Pain Location Elbow   Pain Orientation Right   Pain Descriptors / Indicators Tightness   Pain Score 5   Pain Location Neck   Pain Orientation Right;Left   Pain Descriptors / Indicators Aching;Sharp   Pain Type Acute pain                         [facility] Adult PT Treatment/Exercise - 03/05/17 0001      Neck Exercises: Machines for Strengthening   UBE (Upper Arm Bike) Level 1 x 6 min (3/3)  PT present to discuss progress     Shoulder Exercises: Seated   Other Seated Exercises cervical ROM all planes     Shoulder Exercises: Standing   Other Standing Exercises UE ranger: flexion and abduction on Lt x 20 each     Shoulder Exercises: Pulleys   Flexion 3 minutes     Hand Exercises for Cervical Radiculopathy   Other Hand Exercise for Cervical Radiculopathy velcro  roller pronation/supinationx 3 minutes, then board vertical x 2 minutes     Hand Exercises   Digiticizer --     Ultrasound   Ultrasound Location Rt forearm   Ultrasound Parameters 1.1 w/cm 50% pulsed x 6 minutes   Ultrasound Goals Pain                  PT Short Term Goals - 03/04/17 0951      PT SHORT TERM GOAL #1   Title independent with initial HEP   Status Achieved     PT SHORT TERM GOAL #2   Title ability to reach behind her back and do her hair with >/= 25% greater   Status Achieved     PT SHORT TERM GOAL #3   Title pain with sleeping decreased >/= 25%   Status Achieved     PT SHORT TERM GOAL #4   Title pain with daily activities decreased >/= 25%   Status Achieved           PT Long Term Goals - 03/04/17 0955      PT LONG TERM GOAL #1   Title independent with HEP including returning to her running and prior gym program  Time 8   Period Weeks   Status On-going     PT LONG TERM GOAL #2   Title turning head to look behind while looking behind her with >/= 75% decreased pain and greater ease   Time 8   Period Weeks   Status On-going     PT LONG TERM GOAL #3   Title using arms to fix her hair and comb it with >/= 75% decreased in pain due to increased strength >/= 4/5 in UE   Time 8   Period Weeks   Status On-going     PT LONG TERM GOAL #4   Title being able to use bil. UE to put on sports bras or reach behind her due to full shoulder AROM   Time 8   Period Weeks   Status On-going     PT LONG TERM GOAL #5   Title sleep throung the night due to cervical and shoulder pain decreased >/= 75%   Time 8   Period Weeks   Status On-going     PT LONG TERM GOAL #6   Title ability to lift a bottle of water and shake her protein shake due to right elbow pain decreased >/= 75% and right grip strength >/= 20#   Time 8   Period Weeks   Status On-going               Plan - 03/05/17 0957    Clinical Impression Statement Pt reports 50%  overall improvement in symptoms since the start of care.  Rt elbow is feeling better today.  Pt continues to reports stiffness in the neck and Lt shoulder and 5-7/10 Rt elbow pain.  Pt is limited to use of Rt UE due to pain.  Pt will continue to benefit from skilled Pt for Lt shoulder, Rt elbow and cervical flexibility, strength, manual and modalities.     Rehab Potential Excellent   PT Frequency 2x / week   PT Duration 8 weeks   PT Treatment/Interventions Cryotherapy;Electrical Stimulation;Iontophoresis 4mg /ml Dexamethasone;Moist Heat;Ultrasound;Therapeutic activities;Therapeutic exercise;Neuromuscular re-education;Patient/family education;Passive range of motion;Manual techniques;Dry needling;Taping   PT Next Visit Plan ionto to Rt elbow #6 next, ultrasound forearm as needed;     manual to neck and Rt elbow, limit strengthening secondary to back to back visits;  left cervical rotation   Consulted and Agree with Plan of Care Patient      Patient will benefit from skilled therapeutic intervention in order to improve the following deficits and impairments:  Decreased range of motion, Increased fascial restricitons, Increased muscle spasms, Decreased endurance, Decreased activity tolerance, Pain, Decreased strength, Decreased mobility, Postural dysfunction  Visit Diagnosis: Stiffness of left shoulder, not elsewhere classified  Pain in right elbow  Abnormal posture  Acute pain of left shoulder  Cervicalgia  Muscle weakness (generalized)     Problem List There are no active problems to display for this patient.    Lorrene ReidKelly Nemiah Bubar, PT 03/05/17 10:16 AM  [facility] [address], [address] [place], [place], [address] Phone: [phone]   Fax:  [phone]  Name: Katie Gray MRN: [id number] Date of Birth: [birth year]

## 2017-03-10 ENCOUNTER — Ambulatory Visit: Payer: Managed Care, Other (non HMO)

## 2017-03-10 DIAGNOSIS — M542 Cervicalgia: Secondary | ICD-10-CM

## 2017-03-10 DIAGNOSIS — R293 Abnormal posture: Secondary | ICD-10-CM

## 2017-03-10 DIAGNOSIS — M25512 Pain in left shoulder: Secondary | ICD-10-CM

## 2017-03-10 DIAGNOSIS — M25521 Pain in right elbow: Secondary | ICD-10-CM

## 2017-03-10 DIAGNOSIS — M6281 Muscle weakness (generalized): Secondary | ICD-10-CM

## 2017-03-10 DIAGNOSIS — M25612 Stiffness of left shoulder, not elsewhere classified: Secondary | ICD-10-CM | POA: Diagnosis not present

## 2017-03-10 NOTE — Therapy (Signed)
[facility] [address], [address] [place], [place], [address] Phone: [phone]   Fax:  [phone]  Physical Therapy Treatment  Patient Details  Name: Katie Gray MRN: [id number] Date of Birth: [birth year] Referring Provider: Dr. Darrow Bussing  Encounter Date: 03/10/2017      PT End of Session - 03/10/17 1310    Visit Number 10   Date for PT Re-Evaluation 03/28/17   Authorization Type Patient visit limit includes chiropractor visits and she is seeing one also   PT Start Time 1225   PT Stop Time 1312   PT Time Calculation (min) 47 min   Activity Tolerance Patient tolerated treatment well   Behavior During Therapy [facility] for tasks assessed/performed      Past Medical History:  Diagnosis Date  . Migraines     History reviewed. No pertinent surgical history.  There were no vitals filed for this visit.      Subjective Assessment - 03/10/17 1238    Currently in Pain? Yes   Pain Score 5    Pain Location Elbow   Pain Orientation Right   Pain Descriptors / Indicators Tightness   Pain Type Acute pain   Pain Onset More than a month ago   Pain Frequency Constant   Aggravating Factors  fixing hair, using arm   Pain Relieving Factors ultrasound, elbow strength, neck and Lt shoulder flexibility   Pain Score 5   Pain Location Shoulder   Pain Orientation Left   Pain Descriptors / Indicators Aching   Pain Type Acute pain   Pain Onset More than a month ago   Pain Frequency Constant   Aggravating Factors  reaching overhead   Pain Relieving Factors not reaching back   Pain Score 5   Pain Location Neck   Pain Orientation Right;Left   Pain Descriptors / Indicators Aching;Sharp   Pain Type Acute pain   Pain Onset More than a month ago   Pain Frequency Constant   Aggravating Factors  turning neck   Pain Relieving Factors not turning head            [facility] PT Assessment - 03/10/17 0001      Cognition   Overall  Cognitive Status Within Functional Limits for tasks assessed     AROM   Left Shoulder Flexion 140 Degrees   Left Shoulder ABduction 110 Degrees  pain     Strength   Right Hand Gross Grasp Impaired   Left Hand Gross Grasp --  15#                     [facility] Adult PT Treatment/Exercise - 03/10/17 0001      Neck Exercises: Machines for Strengthening   UBE (Upper Arm Bike) Level 1 x 6 min (3/3)  PT present to discuss progress     Shoulder Exercises: Seated   Other Seated Exercises cervical ROM all planes     Shoulder Exercises: Standing   Other Standing Exercises UE ranger: flexion and abduction on Lt x 20 each     Shoulder Exercises: Pulleys   Flexion 3 minutes     Hand Exercises for Cervical Radiculopathy   Other Hand Exercise for Cervical Radiculopathy velcro roller pronation/supinationx 3 minutes, then board vertical x 2 minutes     Ultrasound   Ultrasound Location bil upper trap, neck   Ultrasound Parameters 1.1 w/cm2 cont x 8 minutes   Ultrasound Goals Pain  PT Short Term Goals - 03/10/17 1242      PT SHORT TERM GOAL #2   Title ability to reach behind her back and do her hair with >/= 25% greater   Status Achieved           PT Long Term Goals - 03/10/17 1242      PT LONG TERM GOAL #1   Title independent with HEP including returning to her running and prior gym program   Time 8   Period Weeks   Status On-going     PT LONG TERM GOAL #2   Title turning head to look behind while looking behind her with >/= 75% decreased pain and greater ease   Time 8   Period Weeks   Status On-going     PT LONG TERM GOAL #3   Title using arms to fix her hair and comb it with >/= 75% decreased in pain due to increased strength >/= 4/5 in UE   Baseline strenth increased and no change in Lt shoulder   Time 8   Period Weeks   Status On-going     PT LONG TERM GOAL #4   Title being able to use bil. UE to put on sports bras or reach  behind her due to full shoulder AROM     PT LONG TERM GOAL #5   Title sleep throung the night due to cervical and shoulder pain decreased >/= 75%   Baseline waking 2x/night   Time 8   Period Weeks   Status On-going     PT LONG TERM GOAL #6   Title ability to lift a bottle of water and shake her protein shake due to right elbow pain decreased >/= 75% and right grip strength >/= 20#   Baseline 15# today   Time 8   Period Weeks   Status On-going               Plan - 03/10/17 1250    Clinical Impression Statement Pt reports 50% improvement in neck, 55% in elbow and no change in shoulder.  Lt grip was 15# today.  Lt shoulder A/ROM 140 degrees flexion, 110 abduction, ER to T1 and IR to T10. Pt with increased tension in bil neck today and responded well to ultrasound.  Pt will continue to benefit from skilled PT for flexbility, strength, endurance, manual and modalities.   Rehab Potential Excellent   PT Frequency 2x / week   PT Duration 8 weeks   PT Treatment/Interventions Cryotherapy;Electrical Stimulation;Iontophoresis 4mg /ml Dexamethasone;Moist Heat;Ultrasound;Therapeutic activities;Therapeutic exercise;Neuromuscular re-education;Patient/family education;Passive range of motion;Manual techniques;Dry needling;Taping   PT Next Visit Plan ionto to Rt elbow #6 next, ultrasound forearm or neck as needed; manual to neck and Rt elbow,   left cervical rotation   Consulted and Agree with Plan of Care Patient      Patient will benefit from skilled therapeutic intervention in order to improve the following deficits and impairments:  Decreased range of motion, Increased fascial restricitons, Increased muscle spasms, Decreased endurance, Decreased activity tolerance, Pain, Decreased strength, Decreased mobility, Postural dysfunction  Visit Diagnosis: Stiffness of left shoulder, not elsewhere classified  Pain in right elbow  Abnormal posture  Cervicalgia  Acute pain of left  shoulder  Muscle weakness (generalized)     Problem List There are no active problems to display for this patient.    [name], PT 03/10/17 1:18 PM  [facility] [address], [address] [place], [place],  [address] Phone: [phone]   Fax:  [phone]  Name: Katie Gray MRN: [id number] Date of Birth: [birth year]

## 2017-03-12 ENCOUNTER — Ambulatory Visit: Payer: Managed Care, Other (non HMO)

## 2017-03-12 DIAGNOSIS — R293 Abnormal posture: Secondary | ICD-10-CM

## 2017-03-12 DIAGNOSIS — M6281 Muscle weakness (generalized): Secondary | ICD-10-CM

## 2017-03-12 DIAGNOSIS — M25612 Stiffness of left shoulder, not elsewhere classified: Secondary | ICD-10-CM

## 2017-03-12 DIAGNOSIS — M25521 Pain in right elbow: Secondary | ICD-10-CM

## 2017-03-12 DIAGNOSIS — M25512 Pain in left shoulder: Secondary | ICD-10-CM

## 2017-03-12 DIAGNOSIS — M542 Cervicalgia: Secondary | ICD-10-CM

## 2017-03-12 NOTE — Therapy (Signed)
[facility] [address] [place], [place], [address] Phone: [phone]   Fax:  [phone]  Physical Therapy Treatment  Patient Details  Name: Katie Gray MRN: [id number] Date of Birth: August 18, 1966 Referring Provider: Dr. Darrow Bussing  Encounter Date: 03/12/2017      PT End of Session - 03/12/17 0927    Visit Number 11   Date for PT Re-Evaluation 03/28/17   Authorization Type Patient visit limit includes chiropractor visits and she is seeing one also   PT Start Time 0846   PT Stop Time 0939   PT Time Calculation (min) 53 min   Activity Tolerance Patient tolerated treatment well   Behavior During Therapy [facility] for tasks assessed/performed      Past Medical History:  Diagnosis Date  . Migraines     History reviewed. No pertinent surgical history.  There were no vitals filed for this visit.      Subjective Assessment - 03/12/17 0849    Subjective I am really tight in my neck and shoulder.     Currently in Pain? Yes   Pain Score 6    Pain Location Elbow   Pain Orientation Right   Pain Score 5   Pain Location Shoulder   Pain Orientation Left   Pain Score 6   Pain Location Neck   Pain Orientation Right;Left                         [facility] Adult PT Treatment/Exercise - 03/12/17 0001      Neck Exercises: Machines for Strengthening   UBE (Upper Arm Bike) Level 1 x 6 min (3/3)  PT present to discuss progress     Shoulder Exercises: Seated   Other Seated Exercises cervical ROM all planes     Shoulder Exercises: Pulleys   Flexion 3 minutes     Modalities   Modalities Moist Heat     Moist Heat Therapy   Number Minutes Moist Heat 10 Minutes   Moist Heat Location Cervical     Iontophoresis   Type of Iontophoresis Dexamethasone   Location Rt lateral epicondyle   Dose 1.0 cc  #6   Time 6 hour wear     Manual Therapy   Manual Therapy Soft tissue mobilization;Myofascial release    Manual therapy comments soft tissue elongation and trigger point release to bil neck and upper traps with pt supine          Trigger Point Dry Needling - 03/12/17 0851    Consent Given? Yes   Muscles Treated Upper Body Suboccipitals muscle group;Oblique capitus;Upper trapezius  cervical multifidi   Upper Trapezius Response Twitch reponse elicited;Palpable increased muscle length   Oblique Capitus Response Twitch response elicited;Palpable increased muscle length   SubOccipitals Response Twitch response elicited;Palpable increased muscle length              PT Education - 03/12/17 0902    Education provided (P)  Yes   Education Details (P)  ionto instructions   Person(s) Educated (P)  Patient   Methods (P)  Explanation;Handout   Comprehension (P)  Verbalized understanding          PT Short Term Goals - 03/10/17 1242      PT SHORT TERM GOAL #2   Title ability to reach behind her back and do her hair with >/= 25% greater   Status Achieved  PT Long Term Goals - 03/10/17 1242      PT LONG TERM GOAL #1   Title independent with HEP including returning to her running and prior gym program   Time 8   Period Weeks   Status On-going     PT LONG TERM GOAL #2   Title turning head to look behind while looking behind her with >/= 75% decreased pain and greater ease   Time 8   Period Weeks   Status On-going     PT LONG TERM GOAL #3   Title using arms to fix her hair and comb it with >/= 75% decreased in pain due to increased strength >/= 4/5 in UE   Baseline strenth increased and no change in Lt shoulder   Time 8   Period Weeks   Status On-going     PT LONG TERM GOAL #4   Title being able to use bil. UE to put on sports bras or reach behind her due to full shoulder AROM     PT LONG TERM GOAL #5   Title sleep throung the night due to cervical and shoulder pain decreased >/= 75%   Baseline waking 2x/night   Time 8   Period Weeks   Status On-going      PT LONG TERM GOAL #6   Title ability to lift a bottle of water and shake her protein shake due to right elbow pain decreased >/= 75% and right grip strength >/= 20#   Baseline 15# today   Time 8   Period Weeks   Status On-going               Plan - 03/12/17 [id number]    Clinical Impression Statement Pt with continued neck stiffness and trigger points in upper traps.  Pt with improved mobility and reduced pain after dry needling today.  Pt with Lt shoulder stiffness and pain with use.  Pt will continue to benefit from skilled PT for strength, flexibility and manual/modalities as needed.     Rehab Potential Excellent   PT Frequency 2x / week   PT Duration 8 weeks   PT Treatment/Interventions Cryotherapy;Electrical Stimulation;Iontophoresis 4mg /ml Dexamethasone;Moist Heat;Ultrasound;Therapeutic activities;Therapeutic exercise;Neuromuscular re-education;Patient/family education;Passive range of motion;Manual techniques;Dry needling;Taping   PT Next Visit Plan  ultrasound forearm or neck as needed; manual to neck and Rt elbow,   left cervical rotation   Consulted and Agree with Plan of Care Patient      Patient will benefit from skilled therapeutic intervention in order to improve the following deficits and impairments:  Decreased range of motion, Increased fascial restricitons, Increased muscle spasms, Decreased endurance, Decreased activity tolerance, Pain, Decreased strength, Decreased mobility, Postural dysfunction  Visit Diagnosis: Abnormal posture  Cervicalgia  Muscle weakness (generalized)  Stiffness of left shoulder, not elsewhere classified  Pain in right elbow  Acute pain of left shoulder     Problem List There are no active problems to display for this patient.   Katie ReidKelly Virgin Gray, PT 03/12/17 9:28 AM  [facility] [address], [address] [place], [place], [address] Phone: [phone]   Fax:   [phone]  Name: Katie HookLori S Gray MRN: [id number] Date of Birth: [birth year]

## 2017-03-12 NOTE — Patient Instructions (Signed)

## 2017-03-17 ENCOUNTER — Ambulatory Visit: Payer: Managed Care, Other (non HMO)

## 2017-03-17 DIAGNOSIS — M25521 Pain in right elbow: Secondary | ICD-10-CM

## 2017-03-17 DIAGNOSIS — M6281 Muscle weakness (generalized): Secondary | ICD-10-CM

## 2017-03-17 DIAGNOSIS — M25612 Stiffness of left shoulder, not elsewhere classified: Secondary | ICD-10-CM

## 2017-03-17 DIAGNOSIS — R293 Abnormal posture: Secondary | ICD-10-CM

## 2017-03-17 DIAGNOSIS — M542 Cervicalgia: Secondary | ICD-10-CM

## 2017-03-17 DIAGNOSIS — M25512 Pain in left shoulder: Secondary | ICD-10-CM

## 2017-03-17 NOTE — Therapy (Signed)
[facility] [address], [address] [place], [place], [address] Phone: [phone]   Fax:  [phone]  Physical Therapy Treatment  Patient Details  Name: DECLYNN LOPRESTI MRN: [id number] Date of Birth: March 25, 1966 Referring Provider: Dr. Darrow Bussing  Encounter Date: 03/17/2017      PT End of Session - 03/17/17 0907    Visit Number 12   Date for PT Re-Evaluation 03/28/17   Authorization Type Patient visit limit includes chiropractor visits and she is seeing one also   PT Start Time 0845   PT Stop Time 0925   PT Time Calculation (min) 40 min   Activity Tolerance Patient tolerated treatment well   Behavior During Therapy [facility] for tasks assessed/performed      Past Medical History:  Diagnosis Date  . Migraines     History reviewed. No pertinent surgical history.  There were no vitals filed for this visit.      Subjective Assessment - 03/17/17 0853    Subjective I wasn't sore after needling.  I felt a little bit better.  My elbow is hurting because i did my hair this morning.     Currently in Pain? Yes   Pain Score 6    Pain Location Elbow   Pain Orientation Right   Pain Descriptors / Indicators Tightness   Pain Type Acute pain   Pain Onset More than a month ago   Pain Frequency Constant   Aggravating Factors  fixing hair, using    Pain Relieving Factors ultrasound, elbow strength   Pain Score 4   Pain Location Shoulder   Pain Orientation Left   Pain Descriptors / Indicators Aching   Pain Type Acute pain   Pain Onset More than a month ago   Pain Frequency Constant   Aggravating Factors  reaching overhead   Pain Relieving Factors not reaching back   Pain Score 4   Pain Location Neck   Pain Orientation Right;Left   Pain Descriptors / Indicators Aching;Sharp   Pain Type Acute pain   Pain Onset More than a month ago   Pain Frequency Constant   Aggravating Factors  moving head   Pain Relieving Factors not  moving                         [facility] Adult PT Treatment/Exercise - 03/17/17 0001      Elbow Exercises   Other elbow exercises velcro board: supination/pronation x 10      Neck Exercises: Machines for Strengthening   UBE (Upper Arm Bike) Level 1 x 6 min (3/3)  PT present to discuss progress     Shoulder Exercises: Seated   Other Seated Exercises cervical ROM all planes     Shoulder Exercises: Standing   Other Standing Exercises UE ranger: flexion and abduction on Lt x 20 each     Shoulder Exercises: Pulleys   Flexion 3 minutes     Hand Exercises for Cervical Radiculopathy   Other Hand Exercise for Cervical Radiculopathy velcro roller pronation/supinationx 3 minutes, then board vertical x 2 minutes     Modalities   Modalities Moist Heat     Moist Heat Therapy   Number Minutes Moist Heat 10 Minutes   Moist Heat Location Cervical     Ultrasound   Ultrasound Location bil upper traps and cervical paraspinals   Ultrasound Parameters 1.1 w/cm2 cont to neck x 6 minutes   Ultrasound Goals Pain  PT Short Term Goals - 03/10/17 1242      PT SHORT TERM GOAL #2   Title ability to reach behind her back and do her hair with >/= 25% greater   Status Achieved           PT Long Term Goals - 03/17/17 0858      PT LONG TERM GOAL #1   Title independent with HEP including returning to her running and prior gym program   Time 8   Period Weeks   Status On-going     PT LONG TERM GOAL #2   Title turning head to look behind while looking behind her with >/= 75% decreased pain and greater ease   Baseline 55%   Time 8   Period Weeks   Status On-going     PT LONG TERM GOAL #3   Title using arms to fix her hair and comb it with >/= 75% decreased in pain due to increased strength >/= 4/5 in UE   Baseline strenth increased and no change in Lt shoulder   Time 8   Period Weeks   Status On-going     PT LONG TERM GOAL #5   Title sleep  throung the night due to cervical and shoulder pain decreased >/= 75%   Baseline waking 2x/night   Time 8   Period Weeks   Status On-going     PT LONG TERM GOAL #6   Title ability to lift a bottle of water and shake her protein shake due to right elbow pain decreased >/= 75% and right grip strength >/= 20#   Baseline 15# today   Time 8   Period Weeks   Status On-going               Plan - 03/17/17 0900    Clinical Impression Statement Pt with increased Lt elbow pain after doing hair this morning.  Pt with 55% overall improvement in neck symptoms since the start of care.  Lt cervical rotation remains painful with driving.  Pt with relief of symptoms after dry needling to neck last week.  Pt will continue to benefit from skilled PT for strength, endurance, manual and flexibility to reduce pain.     Rehab Potential Excellent   PT Frequency 2x / week   PT Duration 8 weeks   PT Treatment/Interventions Cryotherapy;Electrical Stimulation;Iontophoresis 4mg /ml Dexamethasone;Moist Heat;Ultrasound;Therapeutic activities;Therapeutic exercise;Neuromuscular re-education;Patient/family education;Passive range of motion;Manual techniques;Dry needling;Taping   PT Next Visit Plan  ultrasound forearm or neck as needed; manual to neck and Rt elbow,   left cervical rotation   Consulted and Agree with Plan of Care Patient      Patient will benefit from skilled therapeutic intervention in order to improve the following deficits and impairments:  Decreased range of motion, Increased fascial restricitons, Increased muscle spasms, Decreased endurance, Decreased activity tolerance, Pain, Decreased strength, Decreased mobility, Postural dysfunction  Visit Diagnosis: Abnormal posture  Muscle weakness (generalized)  Cervicalgia  Stiffness of left shoulder, not elsewhere classified  Pain in right elbow  Acute pain of left shoulder     Problem List There are no active problems to display for this  patient.    Lorrene ReidKelly Takacs, PT 03/17/17 9:27 AM  [facility] [address], [address] [place], [place], [address] Phone: [phone]   Fax:  [phone]  Name: Mack HookLori S Kight MRN: [id number] Date of Birth: 09-Sep-1966

## 2017-03-19 ENCOUNTER — Ambulatory Visit: Payer: Managed Care, Other (non HMO)

## 2017-03-19 DIAGNOSIS — M542 Cervicalgia: Secondary | ICD-10-CM

## 2017-03-19 DIAGNOSIS — M6281 Muscle weakness (generalized): Secondary | ICD-10-CM

## 2017-03-19 DIAGNOSIS — M25612 Stiffness of left shoulder, not elsewhere classified: Secondary | ICD-10-CM

## 2017-03-19 DIAGNOSIS — M25521 Pain in right elbow: Secondary | ICD-10-CM

## 2017-03-19 DIAGNOSIS — M25512 Pain in left shoulder: Secondary | ICD-10-CM

## 2017-03-19 DIAGNOSIS — R293 Abnormal posture: Secondary | ICD-10-CM

## 2017-03-19 NOTE — Therapy (Signed)
[facility] [address], [address] [place], [place], [address] Phone: [phone]   Fax:  [phone]  Physical Therapy Treatment  Patient Details  Name: Katie HookLori S Gray MRN: [id number] Date of Birth: May 02, 1966 Referring Provider: Dr. Darrow Bussingibas Koirala  Encounter Date: 03/19/2017      PT End of Session - 03/19/17 0923    Visit Number 13   Date for PT Re-Evaluation 03/28/17   Authorization Type Patient visit limit includes chiropractor visits and she is seeing one also   PT Start Time 0850   PT Stop Time 0929   PT Time Calculation (min) 39 min   Activity Tolerance Patient tolerated treatment well   Behavior [facility] for tasks assessed/performed      Past Medical History:  Diagnosis Date  . Migraines     History reviewed. No pertinent surgical history.  There were no vitals filed for this visit.      Subjective Assessment - 03/19/17 0856    Subjective I overslept today.  I am feeling a little bit better today.     Currently in Pain? Yes   Pain Score 4    Pain Location Elbow   Pain Orientation Right   Pain Score 3   Pain Location Shoulder   Pain Orientation Left   Pain Descriptors / Indicators Aching   Pain Score 5   Pain Location Neck   Pain Orientation Left;Right                         [facility] Adult PT Treatment/Exercise - 03/19/17 0001      Posture/Postural Control   Posture/Postural Control No significant limitations     Elbow Exercises   Wrist Flexion Strengthening;Right;20 reps;Bar weights/barbell   Bar Weights/Barbell (Wrist Flexion) 1 lb   Wrist Extension Strengthening;Right;20 reps;Bar weights/barbell   Bar Weights/Barbell (Wrist Extension) 1 lb   Other elbow exercises velcro board: supination/pronation x 10    Other elbow exercises supination and radial deviation 1# x 20 each     Neck Exercises: Machines for Strengthening   UBE (Upper Arm Bike) Level 1 x 6 min (3/3)   PT present to discuss progress     Shoulder Exercises: Standing   Other Standing Exercises UE ranger: flexion and abduction on Lt x 20 each     Shoulder Exercises: Pulleys   Flexion 3 minutes     Hand Exercises for Cervical Radiculopathy   Other Hand Exercise for Cervical Radiculopathy velcro roller pronation/supinationx 3 minutes, then board vertical x 2 minutes     Modalities   Modalities Moist Heat     Moist Heat Therapy   Number Minutes Moist Heat 10 Minutes   Moist Heat Location Cervical     Ultrasound   Ultrasound Location Rt wrist extensor tendons   Ultrasound Parameters 1.0 w/cm2 50% pulsed x 6 minutes   Ultrasound Goals Pain                  PT Short Term Goals - 03/10/17 1242      PT SHORT TERM GOAL #2   Title ability to reach behind her back and do her hair with >/= 25% greater   Status Achieved           PT Long Term Goals - 03/17/17 [id number]      PT LONG TERM GOAL #1   Title independent with HEP including returning to her running and prior gym program  Time 8   Period Weeks   Status On-going     PT LONG TERM GOAL #2   Title turning head to look behind while looking behind her with >/= 75% decreased pain and greater ease   Baseline 55%   Time 8   Period Weeks   Status On-going     PT LONG TERM GOAL #3   Title using arms to fix her hair and comb it with >/= 75% decreased in pain due to increased strength >/= 4/5 in UE   Baseline strenth increased and no change in Lt shoulder   Time 8   Period Weeks   Status On-going     PT LONG TERM GOAL #5   Title sleep throung the night due to cervical and shoulder pain decreased >/= 75%   Baseline waking 2x/night   Time 8   Period Weeks   Status On-going     PT LONG TERM GOAL #6   Title ability to lift a bottle of water and shake her protein shake due to right elbow pain decreased >/= 75% and right grip strength >/= 20#   Baseline 15# today   Time 8   Period Weeks   Status On-going                Plan - 03/19/17 0906    Clinical Impression Statement Pt with improved Rt lebow pain and Lt shoulder pain and increased neck pain today.  Pt reports 55% overall improvement in neck symptoms since the start of care.  Lt cervical rotation remains limited and painful with driving.  Pt didn't want to do dry needling today. Pt will conitnue to benefit from skilled PT for strength, endurance, manual and flexibility to reduce pain.   Rehab Potential Excellent   PT Frequency 2x / week   PT Duration 8 weeks   PT Treatment/Interventions Cryotherapy;Electrical Stimulation;Iontophoresis 4mg /ml Dexamethasone;Moist Heat;Ultrasound;Therapeutic activities;Therapeutic exercise;Neuromuscular re-education;Patient/family education;Passive range of motion;Manual techniques;Dry needling;Taping   PT Next Visit Plan  ultrasound forearm or neck as needed; manual to neck and Rt elbow,   left cervical rotation   Consulted and Agree with Plan of Care Patient      Patient will benefit from skilled therapeutic intervention in order to improve the following deficits and impairments:  Decreased range of motion, Increased fascial restricitons, Increased muscle spasms, Decreased endurance, Decreased activity tolerance, Pain, Decreased strength, Decreased mobility, Postural dysfunction  Visit Diagnosis: Abnormal posture  Muscle weakness (generalized)  Cervicalgia  Stiffness of left shoulder, not elsewhere classified  Pain in right elbow  Acute pain of left shoulder     Problem List There are no active problems to display for this patient.    [name], PT 03/19/17 9:24 AM  [facility] [address], [address] [place], [place], [address] Phone: [phone]   Fax:  [phone]  Name: Katie Gray MRN: [id number] Date of Birth: 1966-05-15

## 2017-03-24 ENCOUNTER — Ambulatory Visit: Payer: Managed Care, Other (non HMO) | Attending: Family Medicine

## 2017-03-24 DIAGNOSIS — M25512 Pain in left shoulder: Secondary | ICD-10-CM | POA: Insufficient documentation

## 2017-03-24 DIAGNOSIS — M542 Cervicalgia: Secondary | ICD-10-CM | POA: Diagnosis present

## 2017-03-24 DIAGNOSIS — M25612 Stiffness of left shoulder, not elsewhere classified: Secondary | ICD-10-CM | POA: Diagnosis present

## 2017-03-24 DIAGNOSIS — M6281 Muscle weakness (generalized): Secondary | ICD-10-CM | POA: Diagnosis present

## 2017-03-24 DIAGNOSIS — R293 Abnormal posture: Secondary | ICD-10-CM | POA: Diagnosis not present

## 2017-03-24 DIAGNOSIS — M25521 Pain in right elbow: Secondary | ICD-10-CM | POA: Insufficient documentation

## 2017-03-24 NOTE — Therapy (Signed)
[facility] [address], [address] [place], [place], [address] Phone: [phone]   Fax:  [phone]  Physical Therapy Treatment  Patient Details  Name: Katie Gray MRN: [id number] Date of Birth: Jun 15, 1966 Referring Provider: Dr. Darrow Bussingibas Koirala  Encounter Date: 03/24/2017      PT End of Session - 03/24/17 0923    Visit Number 14   Date for PT Re-Evaluation 05/09/17   PT Start Time 0847   PT Stop Time 0930   PT Time Calculation (min) 43 min   Activity Tolerance Patient tolerated treatment well   Behavior During Therapy [facility] for tasks assessed/performed      Past Medical History:  Diagnosis Date  . Migraines     History reviewed. No pertinent surgical history.  There were no vitals filed for this visit.          [facility] - 03/24/17 0001      Assessment   Medical Diagnosis S46.912D strain of left shoulder, M25.521 right elbow pain; M62.838 Cervical paraspinal muscle spasm     Prior Function   Level of Independence Independent     Observation/Other Assessments   Focus on Therapeutic Outcomes (FOTO)  47% limitation     Posture/Postural Control   Posture/Postural Control No significant limitations     AROM   Left Shoulder Flexion 120 Degrees  pain   Left Shoulder ABduction 110 Degrees   Cervical - Right Rotation 65   Cervical - Left Rotation 50     PROM   Left Shoulder Flexion 145 Degrees     Strength   Left Shoulder Flexion 3+/5   Left Shoulder ABduction 3+/5   Left Shoulder Internal Rotation 4/5   Left Shoulder External Rotation 4/5   Right Hand Gross Grasp Impaired  15#   Left Hand Gross Grasp Functional  32#                     [facility] Adult PT Treatment/Exercise - 03/24/17 0001      Elbow Exercises   Wrist Flexion Strengthening;Right;20 reps;Bar weights/barbell   Bar Weights/Barbell (Wrist Flexion) 1 lb   Wrist Extension Strengthening;Right;20 reps;Bar  weights/barbell   Bar Weights/Barbell (Wrist Extension) 1 lb   Other elbow exercises velcro board: supination/pronation x 10    Other elbow exercises supination and radial deviation 1# x 20 each     Neck Exercises: Machines for Strengthening   UBE (Upper Arm Bike) Level 1 x 8 min (3/3)  PT present to discuss progress     Shoulder Exercises: Seated   Other Seated Exercises cervical ROM all planes     Shoulder Exercises: Standing   Other Standing Exercises UE ranger: flexion and abduction on Lt x 20 each     Shoulder Exercises: Pulleys   Flexion 3 minutes   ABduction 3 minutes     Hand Exercises for Cervical Radiculopathy   Other Hand Exercise for Cervical Radiculopathy velcro roller pronation/supinationx 3 minutes, then board vertical x 2 minutes     Ultrasound   Ultrasound Location Rt wrist extensors   Ultrasound Parameters 1.0 w/cm2 50% pulsed x 8   Ultrasound Goals Pain                  PT Short Term Goals - 03/10/17 1242      PT SHORT TERM GOAL #2   Title ability to reach behind her back and do her hair with >/= 25% greater   Status  Achieved           PT Long Term Goals - 03/24/17 0854      PT LONG TERM GOAL #1   Title independent with HEP including returning to her running and prior gym program   Time 6   Period Weeks   Status On-going     PT LONG TERM GOAL #2   Title turning head to look behind while looking behind her with >/= 75% decreased pain and greater ease   Baseline 55%   Time 6   Period Weeks   Status On-going     PT LONG TERM GOAL #3   Title using arms to fix her hair and comb it with >/= 75% decreased in pain due to increased strength >/= 4/5 in UE   Baseline 55% improvement and strength improved   Time 6   Period Weeks   Status On-going     PT LONG TERM GOAL #4   Title lift and carry with Rt and Lt arms without limitation due to pain/weakness   Time 6   Period Weeks   Status On-going     PT LONG TERM GOAL #5   Title sleep  throung the night due to cervical and shoulder pain decreased >/= 75%   Baseline waking 2x/night   Time 6   Period Weeks   Status On-going     PT LONG TERM GOAL #6   Title improve grip strength on the Rt to > or = to 25# to improve ability to open jars   Time 6   Period Weeks   Status On-going               Plan - 03/24/17 0907    Clinical Impression Statement Pt reports 55% overall improvement in Rt elbow, Lt shoulder and neck symptoms since the start of care.  Pt with limited Lt shoulder A/ROM, Lt cervical rotation, Rt grip strength and Lt shoulder strength.  Pt is sleeping better and now wakes 2x/night.  Pt with difficulty with use of Lt UE with gripping and lifting.  Pt will follow-up with MD soon to discuss progress.  Pt will continue to benefit from skilled PT for flexibilty, strength and endurance for return to function without limitation.     Rehab Potential Excellent   PT Frequency 2x / week   PT Duration 8 weeks   PT Treatment/Interventions Cryotherapy;Electrical Stimulation;Iontophoresis 4mg /ml Dexamethasone;Moist Heat;Ultrasound;Therapeutic activities;Therapeutic exercise;Neuromuscular re-education;Patient/family education;Passive range of motion;Manual techniques;Dry needling;Taping   PT Next Visit Plan  ultrasound forearm or neck as needed; manual to neck and Rt elbow,   left cervical rotation   Recommended Other Services initial certification is signed.  Recert sent 03/24/17   Consulted and Agree with Plan of Care Patient      Patient will benefit from skilled therapeutic intervention in order to improve the following deficits and impairments:  Decreased range of motion, Increased fascial restricitons, Increased muscle spasms, Decreased endurance, Decreased activity tolerance, Pain, Decreased strength, Decreased mobility, Postural dysfunction  Visit Diagnosis: Abnormal posture - Plan: PT plan of care cert/re-cert  Muscle weakness (generalized) - Plan: PT plan of  care cert/re-cert  Cervicalgia - Plan: PT plan of care cert/re-cert  Stiffness of left shoulder, not elsewhere classified - Plan: PT plan of care cert/re-cert  Pain in right elbow - Plan: PT plan of care cert/re-cert  Acute pain of left shoulder - Plan: PT plan of care cert/re-cert     Problem List There are no active  problems to display for this patient.    [name], PT 03/24/17 9:26 AM  [facility] [address] [place], [place], [address] Phone: [phone]   Fax:  [phone]  Name: Katie Gray MRN: [id number] Date of Birth: 04-15-1966

## 2017-03-27 ENCOUNTER — Ambulatory Visit: Payer: Managed Care, Other (non HMO)

## 2017-03-27 DIAGNOSIS — M6281 Muscle weakness (generalized): Secondary | ICD-10-CM

## 2017-03-27 DIAGNOSIS — M25612 Stiffness of left shoulder, not elsewhere classified: Secondary | ICD-10-CM

## 2017-03-27 DIAGNOSIS — M25512 Pain in left shoulder: Secondary | ICD-10-CM

## 2017-03-27 DIAGNOSIS — R293 Abnormal posture: Secondary | ICD-10-CM | POA: Diagnosis not present

## 2017-03-27 DIAGNOSIS — M25521 Pain in right elbow: Secondary | ICD-10-CM

## 2017-03-27 DIAGNOSIS — M542 Cervicalgia: Secondary | ICD-10-CM

## 2017-03-27 NOTE — Therapy (Signed)
[facility] [address] [place], [place], [address] Phone: [phone]   Fax:  [phone]  Physical Therapy Treatment  Patient Details  Name: LAINEY NELSON MRN: [id number] Date of Birth: [birth year] Referring Provider: Dr. Darrow Bussing  Encounter Date: 03/27/2017      PT End of Session - 03/27/17 0834    Visit Number 14   Date for PT Re-Evaluation 05/09/17   PT Start Time 0805   PT Stop Time 0845   PT Time Calculation (min) 40 min   Activity Tolerance Patient tolerated treatment well   Behavior During Therapy [facility] for tasks assessed/performed      Past Medical History:  Diagnosis Date  . Migraines     History reviewed. No pertinent surgical history.  There were no vitals filed for this visit.      Subjective Assessment - 03/27/17 0820    Subjective I had a really bad evening on 03/25/17.  Going to see MD tomorrow.     Patient Stated Goals reduce pain and get better   Currently in Pain? Yes   Pain Score 5    Pain Location Elbow   Pain Orientation Right   Pain Score 5   Pain Location Shoulder   Pain Orientation Left   Pain Score 5   Pain Location Neck   Pain Orientation Right;Left                         [facility] Adult PT Treatment/Exercise - 03/27/17 0001      Elbow Exercises   Wrist Flexion Strengthening;Right;20 reps;Bar weights/barbell   Bar Weights/Barbell (Wrist Flexion) 1 lb   Wrist Extension Strengthening;Right;20 reps;Bar weights/barbell   Bar Weights/Barbell (Wrist Extension) 1 lb   Other elbow exercises velcro board: supination/pronation x 10    Other elbow exercises supination and radial deviation 1# x 20 each     Neck Exercises: Machines for Strengthening   UBE (Upper Arm Bike) Level 1 x 6 min (3/3)  PT present to discuss progress     Shoulder Exercises: Seated   Other Seated Exercises cervical ROM all planes     Shoulder Exercises: Standing   Other Standing  Exercises UE ranger: flexion and abduction on Lt x 20 each     Shoulder Exercises: Pulleys   Flexion 3 minutes     Hand Exercises for Cervical Radiculopathy   Other Hand Exercise for Cervical Radiculopathy velcro roller pronation/supinationx 3 minutes, then board vertical x 2 minutes     Ultrasound   Ultrasound Location Rt wrist extensors   Ultrasound Parameters 1.0 w/cm2 50% pulsed x 8 minutes   Ultrasound Goals Pain                  PT Short Term Goals - 03/10/17 1242      PT SHORT TERM GOAL #2   Title ability to reach behind her back and do her hair with >/= 25% greater   Status Achieved           PT Long Term Goals - 03/24/17 0854      PT LONG TERM GOAL #1   Title independent with HEP including returning to her running and prior gym program   Time 6   Period Weeks   Status On-going     PT LONG TERM GOAL #2   Title turning head to look behind while looking behind her with >/= 75% decreased pain and greater ease  Baseline 55%   Time 6   Period Weeks   Status On-going     PT LONG TERM GOAL #3   Title using arms to fix her hair and comb it with >/= 75% decreased in pain due to increased strength >/= 4/5 in UE   Baseline 55% improvement and strength improved   Time 6   Period Weeks   Status On-going     PT LONG TERM GOAL #4   Title lift and carry with Rt and Lt arms without limitation due to pain/weakness   Time 6   Period Weeks   Status On-going     PT LONG TERM GOAL #5   Title sleep throung the night due to cervical and shoulder pain decreased >/= 75%   Baseline waking 2x/night   Time 6   Period Weeks   Status On-going     PT LONG TERM GOAL #6   Title improve grip strength on the Rt to > or = to 25# to improve ability to open jars   Time 6   Period Weeks   Status On-going               Plan - 03/27/17 [id number]    Clinical Impression Statement Pt reports 55% overall improvement in symptoms since the start of care.  Pt with limited Lt  shoulder A/ROM, Lt cervical rotation, Rt grip and Lt shoulder strength.  Pt with improved sleep.  Pt with difficulty with use of Lt UE and with gripping and lifting.  Pt will continue to benefit from skilled PT for flexibility, strength and endurance for return to function without limitation.   Rehab Potential Excellent   PT Frequency 2x / week   PT Duration 8 weeks   PT Treatment/Interventions Cryotherapy;Electrical Stimulation;Iontophoresis 4mg /ml Dexamethasone;Moist Heat;Ultrasound;Therapeutic activities;Therapeutic exercise;Neuromuscular re-education;Patient/family education;Passive range of motion;Manual techniques;Dry needling;Taping   PT Next Visit Plan  ultrasound forearm or neck as needed; manual to neck and Rt elbow,   left cervical rotation   Recommended Other Services recert is signed   Consulted and Agree with Plan of Care Patient      Patient will benefit from skilled therapeutic intervention in order to improve the following deficits and impairments:  Decreased range of motion, Increased fascial restricitons, Increased muscle spasms, Decreased endurance, Decreased activity tolerance, Pain, Decreased strength, Decreased mobility, Postural dysfunction  Visit Diagnosis: Abnormal posture  Muscle weakness (generalized)  Cervicalgia  Pain in right elbow  Stiffness of left shoulder, not elsewhere classified  Acute pain of left shoulder     Problem List There are no active problems to display for this patient.   Lorrene ReidKelly Keaghan Bowens, PT 03/27/17 8:37 AM  [place] [address], [address] [place], [place], [address] Phone: [phone]   Fax:  [phone]  Name: Mack HookLori S Weng MRN: [id number] Date of Birth: 04-Oct-1965

## 2017-04-04 ENCOUNTER — Ambulatory Visit: Payer: Managed Care, Other (non HMO) | Admitting: Physical Therapy

## 2017-04-04 ENCOUNTER — Encounter: Payer: Self-pay | Admitting: Physical Therapy

## 2017-04-04 DIAGNOSIS — R293 Abnormal posture: Secondary | ICD-10-CM | POA: Diagnosis not present

## 2017-04-04 DIAGNOSIS — M542 Cervicalgia: Secondary | ICD-10-CM

## 2017-04-04 DIAGNOSIS — M6281 Muscle weakness (generalized): Secondary | ICD-10-CM

## 2017-04-04 DIAGNOSIS — M25612 Stiffness of left shoulder, not elsewhere classified: Secondary | ICD-10-CM

## 2017-04-04 DIAGNOSIS — M25512 Pain in left shoulder: Secondary | ICD-10-CM

## 2017-04-04 DIAGNOSIS — M25521 Pain in right elbow: Secondary | ICD-10-CM

## 2017-04-04 NOTE — Therapy (Signed)
[facility] [address], [address] [place], [place], [address] Phone: [phone]   Fax:  [phone]  Physical Therapy Treatment  Patient Details  Name: Katie Gray MRN: [id number] Date of Birth: 1966-09-09 Referring Provider: Dr. Darrow Bussing  Encounter Date: 04/04/2017      PT End of Session - 04/04/17 0937    Visit Number 15   Date for PT Re-Evaluation 05/09/17   Authorization Type Patient visit limit includes chiropractor visits and she is seeing one also   PT Start Time 0845   PT Stop Time 0933   PT Time Calculation (min) 48 min   Activity Tolerance Patient tolerated treatment well   Behavior During Therapy [facility] for tasks assessed/performed      Past Medical History:  Diagnosis Date  . Migraines     History reviewed. No pertinent surgical history.  There were no vitals filed for this visit.      Subjective Assessment - 04/04/17 0853    Subjective This morning my right arm is hurting me bad. I saw the doctor last week and is referring me to an orthopedist. Neck pain is 505 better.    How long can you sit comfortably? 60 min   Patient Stated Goals reduce pain and get better   Currently in Pain? Yes   Pain Score 5    Pain Location Elbow   Pain Orientation Right   Pain Descriptors / Indicators Tightness   Pain Type Acute pain   Aggravating Factors  lifting, fixing hair   Pain Relieving Factors ultrasound, elbow strength   Multiple Pain Sites Yes   Pain Score 4   Pain Location Shoulder   Pain Orientation Left   Pain Descriptors / Indicators Aching   Pain Type Acute pain   Pain Onset More than a month ago   Pain Frequency Intermittent   Aggravating Factors  reaching overhead and behind her   Pain Relieving Factors not reaching back   Pain Score 4   Pain Location Neck   Pain Orientation Right;Left   Pain Descriptors / Indicators Aching;Sharp   Pain Type Acute pain   Pain Onset More than a month ago    Pain Frequency Intermittent   Aggravating Factors  turning head to left   Pain Relieving Factors not moving                         [facility] Adult PT Treatment/Exercise - 04/04/17 0001      Posture/Postural Control   Posture Comments educated patient on standing tall with shoulders back, chest up to relax the neck muscles     Neck Exercises: Machines for Strengthening   UBE (Upper Arm Bike) Level 1 x 6 min (3/3)  PT present to discuss progress     Neck Exercises: Supine   Other Supine Exercise supine with head on green physioball- press head into ball with shoulder press, nodding with therapist guideing muscles,      Shoulder Exercises: Standing   ABduction AAROM;Both;10 reps   ABduction Limitations therapist is stretching the shoulders while she is doing the movement   Other Standing Exercises UE ranger: flexion and abduction on Lt x 20 each  tactle cues to scapula to assist movement   Other Standing Exercises hands clasped behind her with bringing shoulder down and back     Shoulder Exercises: Pulleys   Flexion 3 minutes     Manual Therapy   Manual Therapy  Soft tissue mobilization;Joint mobilization   Manual therapy comments laying supine with head on physioball stretching bil. pectoralis and rib cage mobilitzation   Joint Mobilization side glide to cervical    Soft tissue mobilization suboccipitals, cervical paraspinals                PT Education - 04/04/17 0937    Education provided Yes   Education Details education on posture   Person(s) Educated Patient   Methods Explanation;Demonstration   Comprehension Verbalized understanding;Returned demonstration          PT Short Term Goals - 03/10/17 1242      PT SHORT TERM GOAL #2   Title ability to reach behind her back and do her hair with >/= 25% greater   Status Achieved           PT Long Term Goals - 04/04/17 0857      PT LONG TERM GOAL #1   Title independent with HEP including  returning to her running and prior gym program   Time 6   Period Weeks   Status On-going  still learning     PT LONG TERM GOAL #2   Title turning head to look behind while looking behind her with >/= 75% decreased pain and greater ease   Time 6   Period Weeks   Status On-going  50% better     PT LONG TERM GOAL #3   Title using arms to fix her hair and comb it with >/= 75% decreased in pain due to increased strength >/= 4/5 in UE   Time 6   Period Weeks   Status On-going  50% better     PT LONG TERM GOAL #4   Title lift and carry with Rt and Lt arms without limitation due to pain/weakness   Time 6   Period Weeks   Status On-going  not easier to lift things     PT LONG TERM GOAL #5   Title sleep throung the night due to cervical and shoulder pain decreased >/= 75%   Time 6   Period Weeks   Status On-going  wake up 2 times per night     PT LONG TERM GOAL #6   Title improve grip strength on the Rt to > or = to 25# to improve ability to open jars   Time 6   Period Weeks   Status On-going               Plan - 04/04/17 1610    Clinical Impression Statement Patient has tightness in left upper trap making the scapula track incorrectly.  Patient shoulders are foward making it difficulty to perform full shoulder abduction.  Patient reports pain decreased by 25% after therapy and easier to move.  Patient  will benefit from skilled PT for flexibility, strength and endurance for retrun to function without limitation.    Rehab Potential Excellent   Clinical Impairments Affecting Rehab Potential None   PT Frequency 2x / week   PT Duration 8 weeks   PT Treatment/Interventions Cryotherapy;Electrical Stimulation;Iontophoresis 4mg /ml Dexamethasone;Moist Heat;Ultrasound;Therapeutic activities;Therapeutic exercise;Neuromuscular re-education;Patient/family education;Passive range of motion;Manual techniques;Dry needling;Taping   PT Next Visit Plan  ultrasound forearm or neck as  needed; manual to neck and Rt elbow,   left cervical rotation; soft tissue work with functional activities   PT Home Exercise Plan cervical ROM and shoulder ROM      Patient will benefit from skilled therapeutic intervention in order to improve the  following deficits and impairments:  Decreased range of motion, Increased fascial restricitons, Increased muscle spasms, Decreased endurance, Decreased activity tolerance, Pain, Decreased strength, Decreased mobility, Postural dysfunction  Visit Diagnosis: Abnormal posture  Muscle weakness (generalized)  Cervicalgia  Pain in right elbow  Stiffness of left shoulder, not elsewhere classified  Acute pain of left shoulder     Problem List There are no active problems to display for this patient.   Eulis FosterCheryl Bruna Dills, PT 04/04/17 9:40 AM   [facility] [address], [address] [place], [place], [address] Phone: [phone]   Fax:  [phone]  Name: Mack HookLori S Frazee MRN: [id number] Date of Birth: 30-Jul-1966

## 2017-04-08 ENCOUNTER — Ambulatory Visit: Payer: Managed Care, Other (non HMO)

## 2017-04-08 DIAGNOSIS — M542 Cervicalgia: Secondary | ICD-10-CM

## 2017-04-08 DIAGNOSIS — M25512 Pain in left shoulder: Secondary | ICD-10-CM

## 2017-04-08 DIAGNOSIS — M25612 Stiffness of left shoulder, not elsewhere classified: Secondary | ICD-10-CM

## 2017-04-08 DIAGNOSIS — M6281 Muscle weakness (generalized): Secondary | ICD-10-CM

## 2017-04-08 DIAGNOSIS — R293 Abnormal posture: Secondary | ICD-10-CM | POA: Diagnosis not present

## 2017-04-08 DIAGNOSIS — M25521 Pain in right elbow: Secondary | ICD-10-CM

## 2017-04-08 NOTE — Therapy (Signed)
[facility] [address], [address] [place], [place], [address] Phone: [phone]   Fax:  [phone]  Physical Therapy Treatment  Patient Details  Name: Katie HookLori S Spackman MRN: [id number] Date of Birth: 13-Dec-1965 Referring Provider: Dr. Darrow Bussingibas Koirala  Encounter Date: 04/08/2017      PT End of Session - 04/08/17 0927    Visit Number 16   Date for PT Re-Evaluation 05/09/17   Authorization Type Patient visit limit includes chiropractor visits and she is seeing one also   PT Start Time 0847   PT Stop Time 0928   PT Time Calculation (min) 41 min   Activity Tolerance Patient tolerated treatment well   Behavior [facility] Therapy [facility] for tasks assessed/performed      Past Medical History:  Diagnosis Date  . Migraines     History reviewed. No pertinent surgical history.  There were no vitals filed for this visit.      Subjective Assessment - 04/08/17 0851    Subjective I still haven't heard from the orthopedic MD yet.  I can turn my head a little better.     Patient Stated Goals reduce pain and get better   Currently in Pain? Yes   Pain Score 4    Pain Location Elbow   Pain Orientation Right   Pain Descriptors / Indicators Tightness   Pain Type Acute pain   Pain Onset More than a month ago   Pain Frequency Constant   Aggravating Factors  lifting, fixing hair   Pain Relieving Factors ultrasound   Pain Score 4   Pain Location Shoulder   Pain Orientation Left   Pain Descriptors / Indicators Aching   Pain Type Acute pain   Pain Onset More than a month ago   Pain Frequency Intermittent   Aggravating Factors  reaching overhead and behind   Pain Relieving Factors not reaching back   Pain Score 4   Pain Location Neck   Pain Orientation Right;Left   Pain Descriptors / Indicators Aching   Pain Type Acute pain   Pain Onset More than a month ago   Pain Frequency Intermittent   Aggravating Factors  turning head to the Lt   Pain Relieving Factors not turning head            [facility] PT Assessment - 04/08/17 0001      Strength   Left Shoulder Flexion 4-/5   Left Shoulder ABduction 4-/5   Left Shoulder Internal Rotation 4/5   Left Shoulder External Rotation 4/5                     [facility] Adult PT Treatment/Exercise - 04/08/17 0001      Elbow Exercises   Other elbow exercises velcro board: supination/pronation x 10      Neck Exercises: Machines for Strengthening   UBE (Upper Arm Bike) Level 1 x 6 min (3/3)  PT present to discuss progress     Neck Exercises: Supine   Other Supine Exercise supine with head on green physioball- press head into ball with shoulder press, nodding with therapist guideing muscles,      Shoulder Exercises: Seated   Other Seated Exercises cervical rotation: on green ball     Shoulder Exercises: Standing   ABduction AAROM;Both;10 reps   ABduction Limitations therapist is stretching the shoulders while she is doing the movement   Shoulder Elevation Limitations wall push ups: 2x10   tactile cues to reduce scapular elevation  Other Standing Exercises UE ranger: flexion and abduction on Lt x 20 each  tactle cues to scapula to assist movement     Shoulder Exercises: Pulleys   Flexion 3 minutes     Hand Exercises for Cervical Radiculopathy   Other Hand Exercise for Cervical Radiculopathy velcro roller pronation/supinationx 3 minutes, then board vertical x 2 minutes                  PT Short Term Goals - 03/10/17 1242      PT SHORT TERM GOAL #2   Title ability to reach behind her back and do her hair with >/= 25% greater   Status Achieved           PT Long Term Goals - 04/08/17 0854      PT LONG TERM GOAL #1   Title independent with HEP including returning to her running and prior gym program   Time 6   Period Weeks   Status On-going     PT LONG TERM GOAL #2   Title turning head to look behind while looking behind her with >/= 75%  decreased pain and greater ease   Time 6   Period Weeks   Status On-going     PT LONG TERM GOAL #5   Title sleep throung the night due to cervical and shoulder pain decreased >/= 75%   Baseline waking 1-2x/night     PT LONG TERM GOAL #6   Title improve grip strength on the Rt to > or = to 25# to improve ability to open jars   Baseline 19#   Time 6   Period Weeks   Status On-going               Plan - 04/08/17 0900    Clinical Impression Statement Pt reports that she has been waking up 1-2x/night with pain.  Pt with improved grip to 19#, Lt shoulder strength ranges 4-/5 to 4/5 with pain upon testing.  Pt is waiting on referral to the orthopedic MD.  Pt with forward shoulder posture and requires guidance to reduce scapular elevation.  Pt will continue to benefit from skilled PT for flexibility, postural strength and endurance to return to function without limitation.   Rehab Potential Excellent   PT Frequency 2x / week   PT Duration 8 weeks   PT Treatment/Interventions Cryotherapy;Electrical Stimulation;Iontophoresis 4mg /ml Dexamethasone;Moist Heat;Ultrasound;Therapeutic activities;Therapeutic exercise;Neuromuscular re-education;Patient/family education;Passive range of motion;Manual techniques;Dry needling;Taping   PT Next Visit Plan  ultrasound forearm or neck as needed; manual to neck and Rt elbow,   left cervical rotation; soft tissue work with functional activities   Consulted and Agree with Plan of Care Patient      Patient will benefit from skilled therapeutic intervention in order to improve the following deficits and impairments:  Decreased range of motion, Increased fascial restricitons, Increased muscle spasms, Decreased endurance, Decreased activity tolerance, Pain, Decreased strength, Decreased mobility, Postural dysfunction  Visit Diagnosis: Abnormal posture  Muscle weakness (generalized)  Cervicalgia  Pain in right elbow  Stiffness of left shoulder, not  elsewhere classified  Acute pain of left shoulder     Problem List There are no active problems to display for this patient.    [name], PT 04/08/17 9:30 AM  [facility] [address], [address] [place], [place], [address] Phone: [phone]   Fax:  [phone]  Name: INITA URAM MRN: [id number] Date of Birth: 31-May-1966

## 2017-04-11 ENCOUNTER — Ambulatory Visit: Payer: Managed Care, Other (non HMO) | Admitting: Rehabilitation

## 2017-04-14 ENCOUNTER — Ambulatory Visit: Payer: Managed Care, Other (non HMO)

## 2017-04-17 ENCOUNTER — Ambulatory Visit: Payer: Managed Care, Other (non HMO)

## 2017-04-17 DIAGNOSIS — M6281 Muscle weakness (generalized): Secondary | ICD-10-CM

## 2017-04-17 DIAGNOSIS — M25512 Pain in left shoulder: Secondary | ICD-10-CM

## 2017-04-17 DIAGNOSIS — R293 Abnormal posture: Secondary | ICD-10-CM | POA: Diagnosis not present

## 2017-04-17 DIAGNOSIS — M25612 Stiffness of left shoulder, not elsewhere classified: Secondary | ICD-10-CM

## 2017-04-17 DIAGNOSIS — M25521 Pain in right elbow: Secondary | ICD-10-CM

## 2017-04-17 DIAGNOSIS — M542 Cervicalgia: Secondary | ICD-10-CM

## 2017-04-17 NOTE — Therapy (Signed)
[facility] [address], [address] [place], [place], [address] Phone: [phone]   Fax:  [phone]  Physical Therapy Treatment  Patient Details  Name: Katie Gray MRN: [id number] Date of Birth: 1966/09/17 Referring Provider: Dr. Darrow Bussingibas Koirala  Encounter Date: 04/17/2017      PT End of Session - 04/17/17 1014    Visit Number 17   Date for PT Re-Evaluation 05/09/17   Authorization Type Patient visit limit includes chiropractor visits and she is seeing one also   PT Start Time 0933   PT Stop Time 1015   PT Time Calculation (min) 42 min   Activity Tolerance Patient tolerated treatment well   Behavior During Therapy [facility] for tasks assessed/performed      Past Medical History:  Diagnosis Date  . Migraines     History reviewed. No pertinent surgical history.  There were no vitals filed for this visit.      Subjective Assessment - 04/17/17 0937    Subjective Pt saw Dr Luiz BlareGraves (orthopedic MD) and had injection in Lt shoulder and Rt elbow.  Pt reports that this has helped.     Currently in Pain? Yes   Pain Score 3    Pain Location Elbow   Pain Orientation Right   Pain Descriptors / Indicators Aching   Pain Type Chronic pain   Pain Onset More than a month ago   Pain Frequency Constant   Aggravating Factors  lifting, gripping, fixing hair   Pain Relieving Factors cortizone injection, pain medication   Pain Score 3   Pain Location Shoulder   Pain Orientation Left   Pain Descriptors / Indicators Aching;Dull   Pain Type Acute pain   Pain Onset More than a month ago   Pain Frequency Intermittent   Aggravating Factors  reaching overhead and behind   Pain Relieving Factors pain medication                         [facility] Adult PT Treatment/Exercise - 04/17/17 0001      Elbow Exercises   Other elbow exercises velcro board: supination/pronation x 10      Neck Exercises: Machines for Strengthening   UBE (Upper Arm Bike) Level 1 x 6 min (3/3)  PT present to discuss progress     Neck Exercises: Supine   Other Supine Exercise supine with head on green physioball- press head into ball with shoulder press, nodding with therapist guideing muscles,      Shoulder Exercises: Seated   Other Seated Exercises cervical rotation: on green ball     Shoulder Exercises: Standing   ABduction AAROM;Both;10 reps   ABduction Limitations therapist is stretching the shoulders while she is doing the movement   Shoulder Elevation Limitations wall push ups: 2x10   tactile cues to reduce scapular elevation   Other Standing Exercises UE ranger: flexion and abduction on Lt x 20 each  tactle cues to scapula to assist movement     Shoulder Exercises: Pulleys   Flexion 3 minutes     Manual Therapy   Manual Therapy Soft tissue mobilization;Joint mobilization   Joint Mobilization side glide to cervical, PA mobs   Soft tissue mobilization suboccipitals, cervical paraspinals                  PT Short Term Goals - 03/10/17 1242      PT SHORT TERM GOAL #2   Title ability to reach behind her  back and do her hair with >/= 25% greater   Status Achieved           PT Long Term Goals - 04/17/17 0939      PT LONG TERM GOAL #1   Title independent with HEP including returning to her running and prior gym program   Time 6   Period Weeks   Status On-going     PT LONG TERM GOAL #2   Title turning head to look behind while looking behind her with >/= 75% decreased pain and greater ease   Baseline 60%   Time 6   Period Weeks   Status On-going     PT LONG TERM GOAL #3   Title using arms to fix her hair and comb it with >/= 75% decreased in pain due to increased strength >/= 4/5 in UE   Baseline 60%    Time 6   Period Weeks   Status On-going     PT LONG TERM GOAL #4   Title lift and carry with Rt and Lt arms without limitation due to pain/weakness   Time 6   Period Weeks   Status On-going      PT LONG TERM GOAL #5   Title sleep throung the night due to cervical and shoulder pain decreased >/= 75%   Baseline waking 1x/wk    Time 6   Period Weeks   Status On-going               Plan - 04/17/17 0950    Clinical Impression Statement Pt is waking up 1x/night with pain.  Lt shoulder and Rt elbow pain is improved after injection last week.  Pt with reduced Lt shoulder strength and reduced Rt grip strength.  Pt with trigger points in bil neck and reduced PA mobility with mobs today.  Pt will continue to benefit from skilled PT for manual, modalities, strength and flexibility.     Rehab Potential Excellent   PT Frequency 2x / week   PT Duration 8 weeks   PT Treatment/Interventions Cryotherapy;Electrical Stimulation;Iontophoresis 4mg /ml Dexamethasone;Moist Heat;Ultrasound;Therapeutic activities;Therapeutic exercise;Neuromuscular re-education;Patient/family education;Passive range of motion;Manual techniques;Dry needling;Taping   PT Next Visit Plan Manual to neck for elongation and mobs, strength and flexiblity of Lt shoulder and elbow.     Consulted and Agree with Plan of Care Patient      Patient will benefit from skilled therapeutic intervention in order to improve the following deficits and impairments:  Decreased range of motion, Increased fascial restricitons, Increased muscle spasms, Decreased endurance, Decreased activity tolerance, Pain, Decreased strength, Decreased mobility, Postural dysfunction  Visit Diagnosis: Abnormal posture  Muscle weakness (generalized)  Cervicalgia  Pain in right elbow  Stiffness of left shoulder, not elsewhere classified  Acute pain of left shoulder     Problem List There are no active problems to display for this patient.   Lorrene ReidKelly Omarius Grantham, PT 04/17/17 10:15 AM  [facility] [address], [address] [place], [place], [address] Phone: [phone]   Fax:  [phone]  Name:  Katie Gray MRN: [id number] Date of Birth: [birth year]

## 2017-04-21 ENCOUNTER — Ambulatory Visit: Payer: Managed Care, Other (non HMO)

## 2017-04-21 DIAGNOSIS — M542 Cervicalgia: Secondary | ICD-10-CM

## 2017-04-21 DIAGNOSIS — M6281 Muscle weakness (generalized): Secondary | ICD-10-CM

## 2017-04-21 DIAGNOSIS — R293 Abnormal posture: Secondary | ICD-10-CM

## 2017-04-21 DIAGNOSIS — M25612 Stiffness of left shoulder, not elsewhere classified: Secondary | ICD-10-CM

## 2017-04-21 DIAGNOSIS — M25521 Pain in right elbow: Secondary | ICD-10-CM

## 2017-04-21 DIAGNOSIS — M25512 Pain in left shoulder: Secondary | ICD-10-CM

## 2017-04-21 NOTE — Therapy (Addendum)
[facility] [address] [place], [place], [address] Phone: [phone]   Fax:  [phone]  Physical Therapy Treatment  Patient Details  Name: Katie Gray MRN: [id number] Date of Birth: 1966/08/29 Referring Provider: Dr. Lujean Amel  Encounter Date: 04/21/2017      PT End of Session - 04/21/17 0928    Visit Number 18   Date for PT Re-Evaluation 05/09/17   Authorization Type Patient visit limit includes chiropractor visits and she is seeing one also   PT Start Time 0844   PT Stop Time 0929   PT Time Calculation (min) 45 min   Activity Tolerance Patient tolerated treatment well   Behavior During Therapy [facility] for tasks assessed/performed      Past Medical History:  Diagnosis Date  . Migraines     History reviewed. No pertinent surgical history.  There were no vitals filed for this visit.      Subjective Assessment - 04/21/17 0854    Subjective I felt more mobile after therapy last session.     Currently in Pain? Yes   Pain Score 2    Pain Location Elbow   Pain Orientation Right   Pain Descriptors / Indicators Aching   Pain Type Chronic pain   Pain Onset More than a month ago   Pain Frequency Constant   Aggravating Factors  lifting, gripping, fixing hair   Pain Relieving Factors pain medication   Pain Score 3   Pain Location Shoulder   Pain Orientation Left   Pain Descriptors / Indicators Aching;Dull   Pain Type Acute pain   Pain Onset More than a month ago   Pain Frequency Intermittent   Aggravating Factors  reaching overhead and behind   Pain Relieving Factors pain medication   Pain Score 4   Pain Location Neck   Pain Orientation Right;Left   Pain Descriptors / Indicators Aching   Pain Type Acute pain   Pain Onset More than a month ago   Pain Frequency Intermittent   Aggravating Factors  turning head to the Lt   Pain Relieving Factors not turning head                          [facility] Adult PT Treatment/Exercise - 04/21/17 0001      Elbow Exercises   Other elbow exercises velcro board: supination/pronation x 10      Neck Exercises: Machines for Strengthening   UBE (Upper Arm Bike) Level 1 x 6 min (3/3)  PT present to discuss progress     Neck Exercises: Supine   Other Supine Exercise supine with head on green physioball- press head into ball with shoulder press, nodding with therapist guideing muscles,      Shoulder Exercises: Seated   Other Seated Exercises cervical rotation: on green ball     Shoulder Exercises: Standing   ABduction AAROM;Both;10 reps   Shoulder Elevation Limitations wall push ups: 2x10   tactile cues to reduce scapular elevation   Other Standing Exercises UE ranger: flexion and abduction on Lt x 20 each  tactle cues to scapula to assist movement     Shoulder Exercises: Pulleys   Flexion 3 minutes     Manual Therapy   Manual Therapy Soft tissue mobilization;Joint mobilization   Joint Mobilization side glide to cervical, PA mobs   Soft tissue mobilization suboccipitals, cervical paraspinals  PT Short Term Goals - 03/10/17 1242      PT SHORT TERM GOAL #2   Title ability to reach behind her back and do her hair with >/= 25% greater   Status Achieved           PT Long Term Goals - 04/17/17 0939      PT LONG TERM GOAL #1   Title independent with HEP including returning to her running and prior gym program   Time 6   Period Weeks   Status On-going     PT LONG TERM GOAL #2   Title turning head to look behind while looking behind her with >/= 75% decreased pain and greater ease   Baseline 60%   Time 6   Period Weeks   Status On-going     PT LONG TERM GOAL #3   Title using arms to fix her hair and comb it with >/= 75% decreased in pain due to increased strength >/= 4/5 in UE   Baseline 60%    Time 6   Period Weeks   Status On-going     PT LONG TERM  GOAL #4   Title lift and carry with Rt and Lt arms without limitation due to pain/weakness   Time 6   Period Weeks   Status On-going     PT LONG TERM GOAL #5   Title sleep throung the night due to cervical and shoulder pain decreased >/= 75%   Baseline waking 1x/wk    Time 6   Period Weeks   Status On-going               Plan - 04/21/17 0859    Clinical Impression Statement (P)  Pt is waking 1x/night with pain.  Lt shoulder and Rt elbow pain is improved after injection 2 weeks ago.  Pt with reduced Lt shoulder strength and Rt grip strength.  Mild trigger points in bil neck and reduced PA mobility with mobs today.  Pt will continue to benefit from skilled PT for manual, modalities, strength and flexibility    Rehab Potential (P)  Excellent   PT Frequency (P)  2x / week   PT Duration (P)  8 weeks   PT Treatment/Interventions (P)  Cryotherapy;Electrical Stimulation;Iontophoresis '4mg'$ /ml Dexamethasone;Moist Heat;Ultrasound;Therapeutic activities;Therapeutic exercise;Neuromuscular re-education;Patient/family education;Passive range of motion;Manual techniques;Dry needling;Taping   PT Next Visit Plan (P)  Manual to neck for elongation and mobs, strength and flexiblity of Lt shoulder and elbow.     Consulted and Agree with Plan of Care (P)  Patient      Patient will benefit from skilled therapeutic intervention in order to improve the following deficits and impairments:  (P) Decreased range of motion, Increased fascial restricitons, Increased muscle spasms, Decreased endurance, Decreased activity tolerance, Pain, Decreased strength, Decreased mobility, Postural dysfunction  Visit Diagnosis: Abnormal posture  Muscle weakness (generalized)  Cervicalgia  Pain in right elbow  Stiffness of left shoulder, not elsewhere classified  Acute pain of left shoulder     Problem List There are no active problems to display for this patient.  Sigurd Sos, PT 04/21/17 9:32 AM PHYSICAL  THERAPY DISCHARGE SUMMARY  Visits from Start of Care: 18  Current functional level related to goals / functional outcomes: See above for current status.     Remaining deficits: See above.  Pt canceled remaining appointments due to financial reasons.    Education / Equipment:  HEP Plan: Patient agrees to discharge.  Patient goals were partially met. Patient  is being discharged due to the patient's request.  ?????          Sigurd Sos, PT 05/19/17 8:34 AM  [facility] [address], [place], [place], [address] Phone: [phone]   Fax:  [phone]  Name: KENYONNA MICEK MRN: [id number] Date of Birth: 1965/11/28

## 2017-05-02 ENCOUNTER — Encounter: Payer: Managed Care, Other (non HMO) | Admitting: Physical Therapy

## 2017-05-06 ENCOUNTER — Encounter: Payer: Managed Care, Other (non HMO) | Admitting: Physical Therapy

## 2017-05-13 ENCOUNTER — Ambulatory Visit
Admission: RE | Admit: 2017-05-13 | Discharge: 2017-05-13 | Disposition: A | Discharge status: Home / Self Care | Payer: Managed Care, Other (non HMO) | Source: Ambulatory Visit | Attending: Family Medicine | Admitting: Family Medicine

## 2017-05-13 ENCOUNTER — Other Ambulatory Visit: Payer: Self-pay | Admitting: Family Medicine

## 2017-05-13 DIAGNOSIS — M62838 Other muscle spasm: Secondary | ICD-10-CM

## 2017-05-16 ENCOUNTER — Ambulatory Visit: Payer: Managed Care, Other (non HMO) | Admitting: Physical Therapy

## 2021-12-18 ENCOUNTER — Other Ambulatory Visit: Payer: Self-pay | Admitting: Family Medicine

## 2021-12-18 ENCOUNTER — Other Ambulatory Visit: Payer: Self-pay

## 2021-12-18 ENCOUNTER — Ambulatory Visit
Admission: RE | Admit: 2021-12-18 | Discharge: 2021-12-18 | Disposition: A | Discharge status: Home / Self Care | Payer: Managed Care, Other (non HMO) | Source: Ambulatory Visit | Attending: Family Medicine | Admitting: Family Medicine

## 2021-12-18 DIAGNOSIS — R051 Acute cough: Secondary | ICD-10-CM

## 2022-09-02 ENCOUNTER — Other Ambulatory Visit: Payer: Self-pay | Admitting: *Deleted

## 2022-09-02 DIAGNOSIS — F17210 Nicotine dependence, cigarettes, uncomplicated: Secondary | ICD-10-CM

## 2022-09-02 DIAGNOSIS — Z122 Encounter for screening for malignant neoplasm of respiratory organs: Secondary | ICD-10-CM

## 2022-09-02 DIAGNOSIS — Z87891 Personal history of nicotine dependence: Secondary | ICD-10-CM

## 2022-10-04 ENCOUNTER — Ambulatory Visit (INDEPENDENT_AMBULATORY_CARE_PROVIDER_SITE_OTHER): Payer: Managed Care, Other (non HMO) | Admitting: Primary Care

## 2022-10-04 ENCOUNTER — Encounter: Payer: Self-pay | Admitting: Primary Care

## 2022-10-04 DIAGNOSIS — Z8669 Personal history of other diseases of the nervous system and sense organs: Secondary | ICD-10-CM | POA: Diagnosis not present

## 2022-10-04 DIAGNOSIS — T7840XA Allergy, unspecified, initial encounter: Secondary | ICD-10-CM | POA: Insufficient documentation

## 2022-10-04 DIAGNOSIS — T7840XD Allergy, unspecified, subsequent encounter: Secondary | ICD-10-CM

## 2022-10-04 DIAGNOSIS — Z87891 Personal history of nicotine dependence: Secondary | ICD-10-CM | POA: Diagnosis not present

## 2022-10-04 HISTORY — DX: Personal history of other diseases of the nervous system and sense organs: Z86.69

## 2022-10-04 HISTORY — DX: Allergy, unspecified, initial encounter: T78.40XA

## 2022-10-04 NOTE — Patient Instructions (Signed)
Thank you for participating in the [facility]. It was our pleasure to meet you today. We will call you with the results of your scan within the next few days. Your scan will be assigned a Lung RADS category score by the physicians reading the scans.  This Lung RADS score determines follow up scanning.  See below for description of categories, and follow up screening recommendations. We will be in touch to schedule your follow up screening annually or based on recommendations of our providers. We will fax a copy of your scan results to your Primary Care Physician, or the physician who referred you to the program, to ensure they have the results. Please call the office if you have any questions or concerns regarding your scanning experience or results.  Our office number is [phone]. Please speak with Denise Phelps, RN. , or  Denise Buckner RN, They are  our Lung Cancer Screening RN.'s If They are unavailable when you call, Please leave a message on the voice mail. We will return your call at our earliest convenience.This voice mail is monitored several times a day.  Remember, if your scan is normal, we will scan you annually as long as you continue to meet the criteria for the program. (Age 58-77, Current smoker or smoker who has quit within the last 15 years). If you are a smoker, remember, quitting is the single most powerful action that you can take to decrease your risk of lung cancer and other pulmonary, breathing related problems. We know quitting is hard, and we are here to help.  Please let us know if there is anything we can do to help you meet your goal of quitting. If you are a former smoker, congratulations. We are proud of you! Remain smoke free! Remember you can refer friends or family members through the number above.  We will screen them to make sure they meet criteria for the program. Thank you for helping us take better care of you by  participating in Lung Screening.  You can receive free nicotine replacement therapy ( patches, gum or mints) by calling 1-800-QUIT NOW. Please call so we can get you on the path to becoming  a non-smoker. I know it is hard, but you can do this!  Lung RADS Categories:  Lung RADS 1: no nodules or definitely non-concerning nodules.  Recommendation is for a repeat annual scan in 12 months.  Lung RADS 2:  nodules that are non-concerning in appearance and behavior with a very low likelihood of becoming an active cancer. Recommendation is for a repeat annual scan in 12 months.  Lung RADS 3: nodules that are probably non-concerning , includes nodules with a low likelihood of becoming an active cancer.  Recommendation is for a 6-month repeat screening scan. Often noted after an upper respiratory illness. We will be in touch to make sure you have no questions, and to schedule your 6-month scan.  Lung RADS 4 A: nodules with concerning findings, recommendation is most often for a follow up scan in 3 months or additional testing based on our provider's assessment of the scan. We will be in touch to make sure you have no questions and to schedule the recommended 3 month follow up scan.  Lung RADS 4 B:  indicates findings that are concerning. We will be in touch with you to schedule additional diagnostic testing based on our provider's  assessment of the scan.  Other options for assistance in smoking cessation (   As covered by your insurance benefits)  Hypnosis for smoking cessation  [employer] [phone]  Acupuncture for smoking cessation  [facility] [phone]   

## 2022-10-04 NOTE — Progress Notes (Signed)
Virtual Visit via Telephone Note  I connected with Katie Gray on 10/04/22 at  1:30 PM EST by telephone and verified that I am speaking with the correct person using two identifiers.  Location: Patient: Home Provider: Office   I discussed the limitations, risks, security and privacy concerns of performing an evaluation and management service by telephone and the availability of in person appointments. I also discussed with the patient that there may be a patient responsible charge related to this service. The patient expressed understanding and agreed to proceed.   Shared Decision Making Visit Lung Cancer Screening Program [id number])   Eligibility: Age 57 y.o. Pack Years Smoking History Calculation 37 (# packs/per year x # years smoked) Recent History of coughing up blood  no Unexplained weight loss? no ( >Than 15 pounds within the last 6 months ) Prior History Lung / other cancer no (Diagnosis within the last 5 years already requiring surveillance chest CT Scans). Smoking Status Current Smoker Former Smokers: Years since quit: < 1 year  Quit Date: NA  Visit Components: Discussion included one or more decision making aids. yes Discussion included risk/benefits of screening. yes Discussion included potential follow up diagnostic testing for abnormal scans. yes Discussion included meaning and risk of over diagnosis. yes Discussion included meaning and risk of False Positives. yes Discussion included meaning of total radiation exposure. yes  Counseling Included: Importance of adherence to annual lung cancer LDCT screening. yes Impact of comorbidities on ability to participate in the program. yes Ability and willingness to under diagnostic treatment. yes  Smoking Cessation Counseling: Current Smokers:  Discussed importance of smoking cessation. yes Information about tobacco cessation classes and interventions provided to patient. yes Patient provided with "ticket" for LDCT  Scan. NA Symptomatic Patient. no  Counseling(Intermediate counseling: > three minutes) [address]406 Diagnosis Code: Tobacco Use Z72.0 Asymptomatic Patient yes  Counseling (Intermediate counseling: > three minutes counseling) [id number] Former Smokers:  Discussed the importance of maintaining cigarette abstinence. yes Diagnosis Code: Personal History of Nicotine Dependence. J00.938 Information about tobacco cessation classes and interventions provided to patient. Yes Patient provided with "ticket" for LDCT Scan. NA Written Order for Lung Cancer Screening with LDCT placed in Epic. Yes (CT Chest Lung Cancer Screening Low Dose W/O CM) [id number] Z12.2-Screening of respiratory organs Z87.891-Personal history of nicotine dependence  I have spent 25 minutes of face to face/ virtual visit   time with Katie Gray discussing the risks and benefits of lung cancer screening. We viewed / discussed a power point together that explained in detail the above noted topics. We paused at intervals to allow for questions to be asked and answered to ensure understanding.We discussed that the single most powerful action that she can take to decrease her risk of developing lung cancer is to quit smoking. We discussed whether or not she is ready to commit to setting a quit date. We discussed options for tools to aid in quitting smoking including nicotine replacement therapy, non-nicotine medications, support groups, Quit Smart classes, and behavior modification. We discussed that often times setting smaller, more achievable goals, such as eliminating 1 cigarette a day for a week and then 2 cigarettes a day for a week can be helpful in slowly decreasing the number of cigarettes smoked. This allows for a sense of accomplishment as well as providing a clinical benefit. I provided her  with smoking cessation  information  with contact information for community resources, classes, free nicotine replacement therapy, and access to mobile apps,  text messaging,  and on-line smoking cessation help. I have also provided her  the office contact information in the event she needs to contact me, or the screening staff. We discussed the time and location of the scan, and that either Doroteo Glassman RN, Joella Prince, RN  or I will call / send a letter with the results within 24-72 hours of receiving them. The patient verbalized understanding of all of  the above and had no further questions upon leaving the office. They have my contact information in the event they have any further questions.  I spent 3-5 minutes counseling on smoking cessation and the health risks of continued tobacco abuse.  I explained to the patient that there has been a high incidence of coronary artery disease noted on these exams. I explained that this is a non-gated exam therefore degree or severity cannot be determined. This patient is not on statin therapy. I have asked the patient to follow-up with their PCP regarding any incidental finding of coronary artery disease and management with diet or medication as their PCP  feels is clinically indicated. The patient verbalized understanding of the above and had no further questions upon completion of the visit.     Martyn Ehrich, NP

## 2022-10-08 ENCOUNTER — Ambulatory Visit
Admission: RE | Admit: 2022-10-08 | Discharge: 2022-10-08 | Disposition: A | Discharge status: Home / Self Care | Payer: Managed Care, Other (non HMO) | Source: Ambulatory Visit | Attending: Acute Care | Admitting: Acute Care

## 2022-10-08 DIAGNOSIS — F17210 Nicotine dependence, cigarettes, uncomplicated: Secondary | ICD-10-CM

## 2022-10-08 DIAGNOSIS — Z122 Encounter for screening for malignant neoplasm of respiratory organs: Secondary | ICD-10-CM

## 2022-10-08 DIAGNOSIS — Z87891 Personal history of nicotine dependence: Secondary | ICD-10-CM

## 2022-10-09 ENCOUNTER — Other Ambulatory Visit: Payer: Self-pay | Admitting: Acute Care

## 2022-10-09 DIAGNOSIS — F17210 Nicotine dependence, cigarettes, uncomplicated: Secondary | ICD-10-CM

## 2022-10-09 DIAGNOSIS — Z87891 Personal history of nicotine dependence: Secondary | ICD-10-CM

## 2022-10-09 DIAGNOSIS — Z122 Encounter for screening for malignant neoplasm of respiratory organs: Secondary | ICD-10-CM

## 2023-02-06 IMAGING — CR DG CHEST 2V
2 series · 2 of 2 positions shown · non-contrast
Comparison: None.

CLINICAL DATA: Cough.

EXAM:
CHEST - 2 VIEW

[w chest pa]
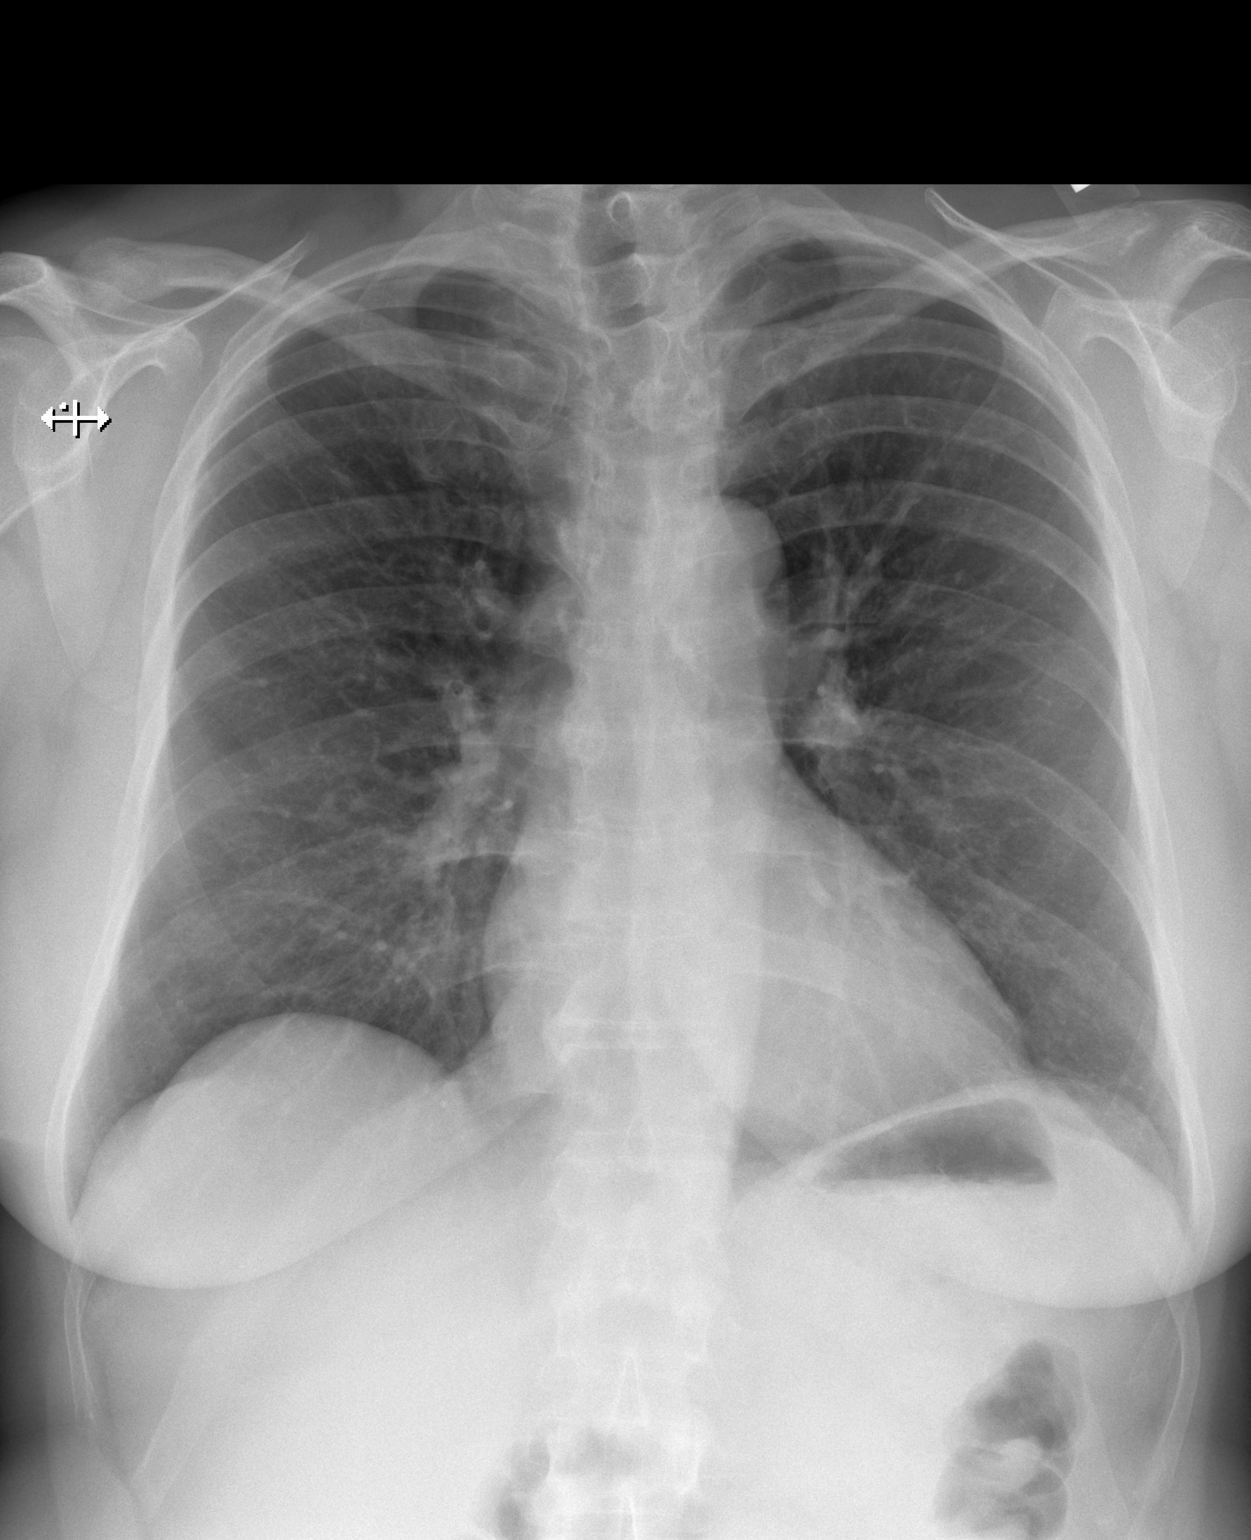

[w chest lat]
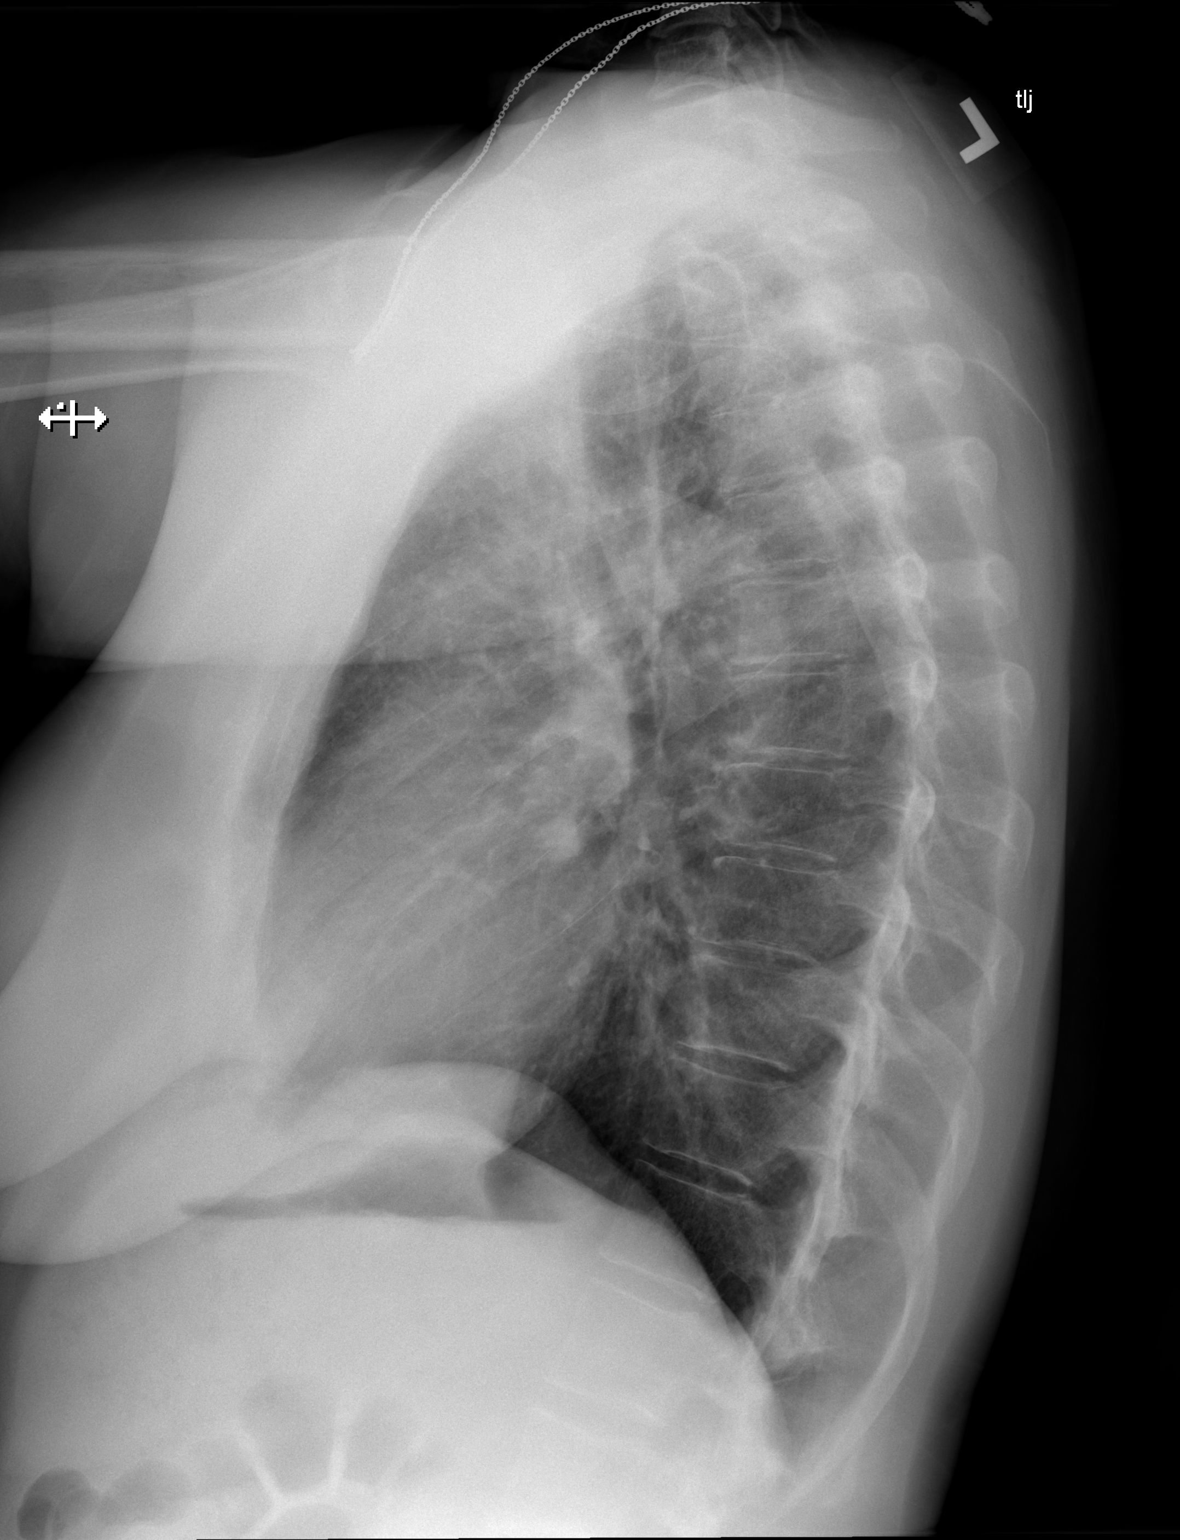

[2 of 2 positions shown; findings below may reference images not displayed]

FINDINGS: The heart size and mediastinal contours are within normal limits.
Both lungs are clear. The visualized skeletal structures are
unremarkable.
IMPRESSION: No active cardiopulmonary disease.

## 2023-06-25 ENCOUNTER — Other Ambulatory Visit: Payer: Self-pay | Admitting: Family Medicine

## 2023-06-25 DIAGNOSIS — G43909 Migraine, unspecified, not intractable, without status migrainosus: Secondary | ICD-10-CM

## 2023-09-05 ENCOUNTER — Other Ambulatory Visit: Payer: Self-pay | Admitting: Acute Care

## 2023-09-05 DIAGNOSIS — Z122 Encounter for screening for malignant neoplasm of respiratory organs: Secondary | ICD-10-CM

## 2023-09-05 DIAGNOSIS — F17210 Nicotine dependence, cigarettes, uncomplicated: Secondary | ICD-10-CM

## 2023-09-05 DIAGNOSIS — Z87891 Personal history of nicotine dependence: Secondary | ICD-10-CM

## 2023-10-10 ENCOUNTER — Other Ambulatory Visit: Payer: Managed Care, Other (non HMO)

## 2024-08-09 ENCOUNTER — Other Ambulatory Visit: Payer: Self-pay | Admitting: Family Medicine

## 2024-08-09 DIAGNOSIS — Q048 Other specified congenital malformations of brain: Secondary | ICD-10-CM

## 2024-08-23 ENCOUNTER — Encounter: Payer: Self-pay | Admitting: Family Medicine

## 2024-08-25 ENCOUNTER — Encounter: Payer: Self-pay | Admitting: Family Medicine

## 2024-09-01 ENCOUNTER — Other Ambulatory Visit
